# Patient Record
Sex: Male | Born: 1980 | Hispanic: Yes | Marital: Single
Health system: Southern US, Community
[De-identification: ages and names within clinical notes are randomized; demographics above are authoritative.]

## PROBLEM LIST (undated history)

## (undated) HISTORY — PX: HERNIA REPAIR: SHX51

---

## 2009-11-10 ENCOUNTER — Ambulatory Visit: Payer: Self-pay | Admitting: Family Medicine

## 2009-11-10 ENCOUNTER — Encounter: Payer: Self-pay | Admitting: Family Medicine

## 2009-11-10 DIAGNOSIS — M545 Low back pain, unspecified: Secondary | ICD-10-CM | POA: Insufficient documentation

## 2009-11-10 DIAGNOSIS — L639 Alopecia areata, unspecified: Secondary | ICD-10-CM | POA: Insufficient documentation

## 2009-11-13 LAB — CONVERTED CEMR LAB
Chloride: 105 meq/L (ref 96–112)
MCHC: 33.6 g/dL (ref 30.0–36.0)
Potassium: 4.2 meq/L (ref 3.5–5.3)
RBC: 4.82 M/uL (ref 4.22–5.81)
RDW: 12.5 % (ref 11.5–15.5)
Sodium: 144 meq/L (ref 135–145)
TSH: 1.697 microintl units/mL (ref 0.350–4.500)

## 2009-12-01 ENCOUNTER — Telehealth: Payer: Self-pay | Admitting: Family Medicine

## 2009-12-16 ENCOUNTER — Ambulatory Visit: Payer: Self-pay | Admitting: Family Medicine

## 2009-12-23 ENCOUNTER — Ambulatory Visit: Payer: Self-pay | Admitting: Family Medicine

## 2009-12-23 DIAGNOSIS — S91309A Unspecified open wound, unspecified foot, initial encounter: Secondary | ICD-10-CM | POA: Insufficient documentation

## 2010-01-13 ENCOUNTER — Telehealth: Payer: Self-pay | Admitting: Family Medicine

## 2010-01-13 ENCOUNTER — Ambulatory Visit: Payer: Self-pay | Admitting: Family Medicine

## 2010-01-13 DIAGNOSIS — M25569 Pain in unspecified knee: Secondary | ICD-10-CM | POA: Insufficient documentation

## 2010-01-28 ENCOUNTER — Telehealth: Payer: Self-pay | Admitting: Family Medicine

## 2010-02-25 ENCOUNTER — Ambulatory Visit: Payer: Self-pay | Admitting: Family Medicine

## 2010-05-13 ENCOUNTER — Ambulatory Visit: Payer: Self-pay | Admitting: Family Medicine

## 2010-07-16 ENCOUNTER — Ambulatory Visit: Payer: Self-pay | Admitting: Family Medicine

## 2010-08-18 ENCOUNTER — Ambulatory Visit: Payer: Self-pay | Admitting: Family Medicine

## 2010-10-05 NOTE — Assessment & Plan Note (Signed)
Summary: knee pain/[place]   Vital Signs:  Patient profile:   30 year old male Height:      63 inches Weight:      123 pounds BMI:     21.87 BSA:     1.57 Temp:     98.6 degrees F Pulse rate:   57 / minute BP sitting:   114 / 66  Vitals Entered By: Jone Baseman CMA (Jan 13, 2010 2:49 PM) CC: right knee pain x 1 day Is Patient Diabetic? No Pain Assessment Patient in pain? yes     Location: right knee Intensity: 7   CC:  right knee pain x 1 day.  History of Present Illness: 1. R knee pain Noticed R knee pain yesterday. Progressed today. Denies any injury. No history of problems with the knee. Denies any swelling. Has taken tylenol with no improvement in symptoms.   no other joint aches.   fevers:   no  chills: no     nausea: no    vomiting: no    diarrhea: no       Habits & Providers  Alcohol-Tobacco-Diet     Tobacco Status: never  Current Medications (verified): 1)  Betamethasone Dipropionate 0.05 % Crea (Betamethasone Dipropionate) .... Apply To Affected Areas and 1 Cm Beyond Two Times A Day 2)  Diclofenac Sodium 75 Mg Tbec (Diclofenac Sodium) .... One By Mouth Two Times A Day As Needed For Back Pain; Take With Food 3)  Flexeril 5 Mg Tabs (Cyclobenzaprine Hcl) .... One By Mouth Three Times A Day As Needed For Back Pain 4)  Lidocaine-Prilocaine 2.5-2.5 % Crea (Lidocaine-Prilocaine) .... Apply Generously To Areas On Scalp 1.5-2 Hours Before Appointment Then Cover With Tight-Fitting Shower Cap or Cap Until Seen  Allergies (verified): No Known Drug Allergies  Physical Exam  General:  vital signs reviewed and normal Alert, appropriate; well-dressed and well-nourished  Msk:  Knee: Normal to inspection with no erythema or effusion or obvious bony abnormalities. Palpation normal with no warmth or joint line tenderness or patellar tenderness; + tenderness at fibular head ROM normal in flexion and extension and lower leg rotation. Ligaments with solid consistent  endpoints including ACL, PCL, LCL, MCL. Negative Mcmurray's and provocative meniscal tests. Pain with pivot-shift testing. Pt has pain with full extension and unilateral R leg stance . Non painful patellar compression. Patellar and quadriceps tendons unremarkable. Hamstring and quadriceps strength is normal.     Impression & Recommendations:  Problem # 1:  KNEE PAIN, ACUTE (ICD-719.46) Assessment New acute onset. Normal gait. Exam positive for tenderness at fibular head and difficulty with single leg standing; otherwise unremarkable. Possibly tendonitis. No injury. Diclofenac has not helped so will change to naproxen. Ice, elevation otherwise. Knee sleeve given. If no improvement would consider knee films. Return parameters discussed.  Patient agreeable.   The following medications were removed from the medication list:    Diclofenac Sodium 75 Mg Tbec (Diclofenac sodium) ..... One by mouth two times a day as needed for back pain; take with food His updated medication list for this problem includes:    Flexeril 5 Mg Tabs (Cyclobenzaprine hcl) ..... One by mouth three times a day as needed for back pain    Naproxen 250 Mg Tabs (Naproxen) ..... One tab every 6 hours as needed for pain  Orders: [facility]- Est Level  3 ([address])  Complete Medication List: 1)  Betamethasone Dipropionate 0.05 % Crea (Betamethasone dipropionate) .... Apply to affected areas and 1 cm beyond two times  a day 2)  Flexeril 5 Mg Tabs (Cyclobenzaprine hcl) .... One by mouth three times a day as needed for back pain 3)  Lidocaine-prilocaine 2.5-2.5 % Crea (Lidocaine-prilocaine) .... Apply generously to areas on scalp 1.5-2 hours before appointment then cover with tight-fitting shower cap or cap until seen 4)  Naproxen 250 Mg Tabs (Naproxen) .... One tab every 6 hours as needed for pain  Patient Instructions: 1)  ice the knee three times a day for 20 minutes 2)  keep it elevated when you're not moving 3)  take the pain  medicine as needed 4)  stop the diclofenac Prescriptions: NAPROXEN 250 MG TABS (NAPROXEN) one tab every 6 hours as needed for pain  #60 x 0   Entered and Authorized by:   Myrtie Soman  MD   Signed by:   Myrtie Soman  MD on 01/13/2010   Method used:   Electronically to        [facility] (retail)       [address]       [place], [place]  [address]       Ph: [phone]       Fax: [phone]   RxID:   [id number]

## 2010-10-05 NOTE — Progress Notes (Signed)
Summary: resch  Phone Note Call from Patient   Caller: Patient Summary of Call: had to resch due to having to work Initial call taken by: De Nurse,  Jan 28, 2010 9:32 AM

## 2010-10-05 NOTE — Progress Notes (Signed)
Summary: triage  Phone Note Call from Patient Call back at [phone]   Caller: other Relative-Karen Summary of Call: n/v/fever Initial call taken by: De Nurse,  December 01, 2009 8:40 AM  Follow-up for Phone Call        Spoke with [place]. started yesterday. took tylnol last night. unable to come this am. placed in 3pm work in. they are aware of wait. told her to have him take the tylenol every 4 hrs if still feverish. advised small sips of ginger ale or other fluids every 10 minutes as tolerated. when he begins to eat again, try crackers first. nothing fried or greasy. she agreed with plan & said if he feels better she might call & cancel appt Follow-up by: Golden Circle RN,  December 01, 2009 8:51 AM

## 2010-10-05 NOTE — Assessment & Plan Note (Signed)
Summary: F/U VISIT/[facility]   Vital Signs:  Patient profile:   30 year old male Height:      63 inches Weight:      124 pounds BMI:     22.05 BSA:     1.58 Temp:     98.2 degrees F Pulse rate:   61 / minute BP sitting:   117 / 67  Vitals Entered By: Jone Baseman CMA (February 25, 2010 4:26 PM) CC: f/u hair Is Patient Diabetic? No Pain Assessment Patient in pain? no        CC:  f/u hair.  History of Present Illness: 1. 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 Has had good hair growth since last visit. Given good response, would like only 3 areas injected today.  Habits & Providers  Alcohol-Tobacco-Diet     Tobacco Status: never  Allergies: No Known Drug Allergies  Physical Exam  Head:  3 scattered discrete patches of hair loss - largest quarter-size; fine hairs--some dark are regrowing in these areas -- no scarring; no scaling lesions Additional Exam:  Intralesional steroid injections:  Pt identified and consent obtained. Areas of alopecia cleaned and prepped in sterile fashion. Multiple intralesional injections of kennalog 10 mg/mL were injected into the areas of alopecia -- 3 areas in total injected -- all on the crown. Pt tolerated the procedure without difficulty. No signficant bleeding. Total of 1.2 mL of triamcinolone injected.    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 Would like to to wait 8 weeks before next set of injections as wife's due date is in July. Has had a good response so far.   Orders: Provider Misc Charge- [facility] (Misc)  Complete Medication List: 1)  Betamethasone Dipropionate 0.05 % Crea (Betamethasone dipropionate) .... Apply to affected areas and 1 cm beyond two times a day 2)  Flexeril 5 Mg Tabs (Cyclobenzaprine hcl) .... One by mouth three times a day as needed for back pain 3)  Lidocaine-prilocaine 2.5-2.5 % Crea  (Lidocaine-prilocaine) .... Apply generously to areas on scalp 1.5-2 hours before appointment then cover with tight-fitting shower cap or cap until seen 4)  Naproxen 250 Mg Tabs (Naproxen) .... One tab every 6 hours as needed for pain  Patient Instructions: 1)  follow-up in 8 weeks for another set of injections. 2)  I'll talk to Dr. Wallene Huh about you.

## 2010-10-05 NOTE — Progress Notes (Signed)
Summary: triage  Phone Note Call from Patient Call back at Home Phone [phone]   Caller: Patient Summary of Call: having problem w/ knee and wants to be seen today Initial call taken by: De Nurse,  Jan 13, 2010 10:22 AM  Follow-up for Phone Call        pain started yesterday. difficult to walk. took tylenol last night. has other meds for this on med list. cannot come until 3 today. aware there may be a wait Follow-up by: Golden Circle RN,  Jan 13, 2010 10:28 AM

## 2010-10-05 NOTE — Assessment & Plan Note (Signed)
Summary: STEROID INJ/KH   Vital Signs:  Patient profile:   30 year old male Height:      63 inches Weight:      122.4 pounds BMI:     21.76 Temp:     98.0 degrees F oral Pulse rate:   61 / minute BP sitting:   108 / 64  (right arm) Cuff size:   regular  Vitals Entered By: Garen Grams LPN (December 23, 2009 3:22 PM) CC: steriod injection Is Patient Diabetic? No Pain Assessment Patient in pain? no        CC:  steriod injection.  History of Present Illness: 1. Alopecia areata Here for intralesional steroid injection to areas of alopecia on scalp. Pt self-treated with EMLA prior to today's visit.  2. tetanus shot Pt would like a tetanus booster. He notes that he stepped on a nail about a week ago. The wound has healed without an problems. States his last tetanus shot was 11 or 12 years ago. No redness, pain, erythema, drainage, or discharge from the area.   ROS: occasional back pain after work; ros otherwise negative.   Habits & Providers  Alcohol-Tobacco-Diet     Tobacco Status: never  Allergies: No Known Drug Allergies  Physical Exam  General:  vital signs reviewed and normal Alert, appropriate; well-dressed and well-nourished  Head:  5 scattered discrete patches of hair loss - largest quarter-sized -- no scarring; no scaling lesions; some fine new hairs coming in most of the areas.  Extremities:  L foot -- small hyper pigmented area on plantar forefoot. No erythema, drainage, or discharge. No tenderness to palpation. Additional Exam:  Intralesional steroid injections:  Pt identified and consent obtained. Areas of alopecia cleaned and prepped in sterile fashion. Multiple intralesional injections of kennalog 10 mg/mL were injected into the areas of alopecia -- five areas in total include bilateral temporal areas, posterior R neckline, 2 areas on the crown. Pt tolerated the procedure without difficulty. No signficant bleeding. Total of 2 mL of triamcinolone injected.     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 Will have back in 4 weeks to eval for hair regrowth and consider repeat steroid injections at that time.   Orders: [facility]- Est Level  3 ([address]) Provider Misc Charge- [facility] (Misc)  Problem # 2:  WOUND, FOOT (ICD-892.0) Assessment: New  resolving. No active signs of infection. Will give tetanus toxoid today in the form of Tdap.   Orders: [facility]- Est Level  3 ([address])  Complete Medication List: 1)  Betamethasone Dipropionate 0.05 % Crea (Betamethasone dipropionate) .... Apply to affected areas and 1 cm beyond two times a day 2)  Diclofenac Sodium 75 Mg Tbec (Diclofenac sodium) .... One by mouth two times a day as needed for back pain; take with food 3)  Flexeril 5 Mg Tabs (Cyclobenzaprine hcl) .... One by mouth three times a day as needed for back pain 4)  Lidocaine-prilocaine 2.5-2.5 % Crea (Lidocaine-prilocaine) .... Apply generously to areas on scalp 1.5-2 hours before appointment then cover with tight-fitting shower cap or cap until seen  Other Orders: Tdap => 75yrs IM ([address]) Admin 1st Vaccine ([address])  Patient Instructions: 1)  follow-up in 4 weeks 2)  ice and massage your head tonight.   Prevention & Chronic Care Immunizations   Influenza vaccine: Not documented    Tetanus booster: 12/23/2009: Tdap    Pneumococcal vaccine: Not documented  Other Screening   Smoking  status: never  (12/23/2009)   Immunizations Administered:  Tetanus Vaccine:    Vaccine Type: Tdap    Site: left deltoid    Mfr: [employer]    Dose: 0.5 ml    Route: IM    Given by: Jone Baseman CMA    Exp. Date: 11/28/2011    Lot #: [id number]    VIS given: 07/24/07 version given December 23, 2009.

## 2010-10-05 NOTE — Assessment & Plan Note (Signed)
Summary: fu/kh   Vital Signs:  Patient profile:   30 year old male Height:      63 inches Weight:      122.8 pounds BMI:     21.83 Temp:     97.1 degrees F oral Pulse rate:   52 / minute BP sitting:   103 / 62  (left arm) Cuff size:   regular  Vitals Entered By: Garen Grams LPN (December 16, 2009 4:05 PM) CC: f/u hair loss Is Patient Diabetic? No Pain Assessment Patient in pain? no        CC:  f/u hair loss.  History of Present Illness: 1. hair loss Has patches of hair loss (3 spots) on scalp -- going on for about 1.5 years. Had a similar episode about 7 years ago. Doctors attributed it to stress - prescribed rogaine, vitamins and hair grew back. Has been using the betamethasone cream consistently for the last month and has not noticed any improvement.  Labs from previous visit including TSH, CBC, and BMP all normal.  Habits & Providers  Alcohol-Tobacco-Diet     Tobacco Status: never  Current Medications (verified): 1)  Betamethasone Dipropionate 0.05 % Crea (Betamethasone Dipropionate) .... Apply To Affected Areas and 1 Cm Beyond Two Times A Day 2)  Diclofenac Sodium 75 Mg Tbec (Diclofenac Sodium) .... One By Mouth Two Times A Day As Needed For Back Pain; Take With Food 3)  Flexeril 5 Mg Tabs (Cyclobenzaprine Hcl) .... One By Mouth Three Times A Day As Needed For Back Pain 4)  Lidocaine-Prilocaine 2.5-2.5 % Crea (Lidocaine-Prilocaine) .... Apply Generously To Areas On Scalp 1.5-2 Hours Before Appointment Then Cover With Tight-Fitting Shower Cap or Cap Until Seen  Allergies (verified): No Known Drug Allergies  Social History: Smoking Status:  never  Review of Systems       no weight loss, no increased stress; normal appetite.   Physical Exam  General:  vital signs reviewed and normal Alert, appropriate; well-dressed and well-nourished  Head:  5 scattered discrete patches of hair loss - largest quarter-sized -- no scarring; no scaling lesions; some fine new hairs  coming in most of the areas.  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 Will have back for intralesional steroid injections. Prescribed EMLA cream 1.5 -2 hours prior to appointment.  Orders: [facility]- Est Level  3 ([address])  Complete Medication List: 1)  Betamethasone Dipropionate 0.05 % Crea (Betamethasone dipropionate) .... Apply to affected areas and 1 cm beyond two times a day 2)  Diclofenac Sodium 75 Mg Tbec (Diclofenac sodium) .... One by mouth two times a day as needed for back pain; take with food 3)  Flexeril 5 Mg Tabs (Cyclobenzaprine hcl) .... One by mouth three times a day as needed for back pain 4)  Lidocaine-prilocaine 2.5-2.5 % Crea (Lidocaine-prilocaine) .... Apply generously to areas on scalp 1.5-2 hours before appointment then cover with tight-fitting shower cap or cap until seen  Patient Instructions: 1)  follow-up in 2 weeks for steroid injections 2)  use the EMLA cream to help reduce pain with the injections 3)  The other medicines should be at the pharmacy. Prescriptions: FLEXERIL 5 MG TABS (CYCLOBENZAPRINE HCL) one by mouth  three times a day as needed for back pain  #60 x 1   Entered and Authorized by:   Myrtie Soman  MD   Signed by:   Myrtie Soman  MD on 12/16/2009   Method used:   Electronically to        [facility] (retail)       [address]       [place], [place]  [address]       Ph: [phone]       Fax: [phone]   RxID:   [id number] DICLOFENAC SODIUM 75 MG TBEC (DICLOFENAC SODIUM) one by mouth two times a day as needed for back pain; take with food  #60 x 1   Entered and Authorized by:   Myrtie Soman  MD   Signed by:   Myrtie Soman  MD on 12/16/2009   Method used:   Electronically to        [facility] (retail)       [address]       [place], [place]  [address]       Ph: [phone]       Fax: [phone]   RxID:   [id number] LIDOCAINE-PRILOCAINE 2.5-2.5 % CREA (LIDOCAINE-PRILOCAINE) apply generously to areas on scalp 1.5-2 hours before appointment then cover with tight-fitting shower cap or cap until seen  #30 g x 0   Entered and Authorized by:   Myrtie Soman  MD   Signed by:   Myrtie Soman  MD on 12/16/2009   Method used:   Electronically to        [facility] (retail)       [address]       [place], [place]  [address]       Ph: [phone]       Fax: [phone]   RxID:   [id number]

## 2010-10-05 NOTE — Assessment & Plan Note (Signed)
Summary: NP,tcb   Vital Signs:  Patient profile:   30 year old male Height:      63 inches Weight:      123 pounds BMI:     21.87 BSA:     1.57 Temp:     98.3 degrees F Pulse rate:   82 / minute BP sitting:   135 / 79  Vitals Entered By: Jone Baseman CMA (November 10, 2009 4:06 PM) CC: New patient Is Patient Diabetic? No Pain Assessment Patient in pain? yes     Location: back Intensity: 6   CC:  New patient.  History of Present Illness: 1. back pain -  Location: low back Duration: 2 years trauma/injury?: horse fell on top of him in [place] about 12 years - no back pain after this radiation?: no  weakness/numbness?: occasionally feels weak in the legs problems with bowel or bladder control?: sometimes has urinary urgency, no accidents history of cancer?: no history of osteoporosis?: no self-treatment:  nothing  2. hair loss Has patches of hair loss (3 spots) on scalp -- going on for about 1.5 years. Had a similar episode about 7 years ago. Doctors attributed it to stress - prescribed rogaine, vitamins and hair grew back.   Habits & Providers  Alcohol-Tobacco-Diet     Tobacco Status: quit     Year Quit: 1.5 years  Current Medications (verified): 1)  Betamethasone Dipropionate 0.05 % Crea (Betamethasone Dipropionate) .... Apply To Affected Areas and 1 Cm Beyond Two Times A Day 2)  Diclofenac Sodium 75 Mg Tbec (Diclofenac Sodium) .... One By Mouth Two Times A Day As Needed For Back Pain; Take With Food 3)  Flexeril 5 Mg Tabs (Cyclobenzaprine Hcl) .... One By Mouth Three Times A Day As Needed For Back Pain  Allergies (verified): No Known Drug Allergies  Past History:  Past Medical History: chronic back pain hair loss  Past Surgical History: hernia surgery at age 20  Family History: father - diabetes, HLD mother - HLD  no cancer, no bleeding or clotting problems  Social History: works Holiday representative as it comes; quit smoking 1.5 years ago 10 years (1  PPD); no alcohol; - stopped 1.5 years ago no current drug use (previously used cocaine - stopped 1.5 years); wife is pregnant with first son (due July 2011); moved from [place] 10 years ago. Smoking Status:  quit  Review of Systems       normal appetite; no recent weight gain or weight loss; no new vision changes; no chest pain, occasional SOB. No syncope or presyncope.   Physical Exam  General:  vital signs reviewed and normal Alert, appropriate; well-dressed and well-nourished  Head:  5 scattered discrete patches of complete hair loss - largest quarter-sized -- no scarring; no scaling lesions; negative fluorescence with woods lamp Neck:  normal ROM, no stiffness; no lymphadenopathy, no masses Lungs:  work of breathing unlabored, clear to auscultation bilaterally; no wheezes, rales, or ronchi; good air movement throughout  Heart:  regular rate and rhythm, no murmurs; normal s1/s2  Msk:  Back exam -- TTP over L2-L4, no point tenderness. no obvious spasm. Normal ROM with forward, lateral flexion, rotatioin and extension of spine. Some pain at extent of ROM with each. SLR negative bilaterally. Patellar and achilles DTRs normal bilaterally. Normal sensation bilateral LEs. Babinski equivocal bilaterally. No saddle anesthesia Extremities:  no cyanosis, clubbing, or edema  Neurologic:  alert and oriented. speech normal. station and gait normal. no gross deficitis.  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 Start topical high-potency steroids and follow. If no improvement would consider referral to derm.   Orders: Basic Met-[facility] [id number]) CBC-[facility] ([address]) TSH-[facility] [phone])  Problem # 2:  LOW BACK PAIN, CHRONIC (ICD-724.2) Assessment: New no red flags; defer imaging at this time. Doubt that history of remote trauma (horse falling on him) is related. 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 ROM, strengthening exercises; flexeril and diclofenac as needed.  His updated medication list for this problem includes:    Diclofenac Sodium 75 Mg Tbec (Diclofenac sodium) ..... One by mouth two times a day as needed for back pain; take with food    Flexeril 5 Mg Tabs (Cyclobenzaprine hcl) ..... One by mouth three times a day as needed for back pain  Complete Medication List: 1)  Betamethasone Dipropionate 0.05 % Crea (Betamethasone dipropionate) .... Apply to affected areas and 1 cm beyond two times a day 2)  Diclofenac Sodium 75 Mg Tbec (Diclofenac sodium) .... One by mouth two times a day as needed for back pain; take with food 3)  Flexeril 5 Mg Tabs (Cyclobenzaprine hcl) .... One by mouth three times a day as needed for back pain  Patient Instructions: 1)  use the steroid cream twice a day on the balding areas 2)  use the diclofac and flexeril as needed 3)  do the back exercises to help with strength and range of motion 4)  follow-up with me in 1-2 months Prescriptions: FLEXERIL 5 MG TABS (CYCLOBENZAPRINE HCL) one by mouth three times a day as needed for back pain  #60 x 1   Entered and Authorized by:   Myrtie Soman  MD   Signed by:   Myrtie Soman  MD on 11/10/2009   Method used:   Electronically to        [facility] (retail)       [address]       [place], [place]  [address]       Ph: [phone]       Fax: [phone]   RxID:   [id number] DICLOFENAC SODIUM 75 MG TBEC (DICLOFENAC SODIUM) one by mouth two times a day as needed for back pain; take with food  #60 x 1   Entered and Authorized by:   Myrtie Soman  MD   Signed by:   Myrtie Soman  MD on 11/10/2009   Method used:   Electronically to        [facility] (retail)       [address]       [place], [place]  [address]       Ph: [phone]       Fax: [phone]   RxID:   [id number] BETAMETHASONE DIPROPIONATE 0.05 %  CREA (BETAMETHASONE DIPROPIONATE) apply to affected areas and 1 cm beyond two times a day  #60 g x 1   Entered and Authorized by:   Myrtie Soman  MD   Signed by:   Myrtie Soman  MD on 11/10/2009   Method used:   Electronically to        [facility] (retail)       [address]       [place], [place]  [address]       Ph: [phone]       Fax: [phone]   RxID:   [id number]

## 2010-10-07 NOTE — Assessment & Plan Note (Signed)
Summary: injection/eo   Vital Signs:  Patient profile:   30 year old male Height:      63 inches Weight:      131 pounds BMI:     23.29 BSA:     1.62 Temp:     97.5 degrees F Pulse rate:   51 / minute BP sitting:   108 / 65  Vitals Entered By: Jone Baseman CMA (August 18, 2010 3:35 PM) CC: alopecia areata    Primary Care Valeta Paz:  Mark Rumpf  MD  CC:  alopecia areata .  History of Present Illness: 1) Alopecia areata: Last treated in November w/ intralesional triamcinolone for areas of alopecia areata on scalp (after failure of topical corticosteroid). Has shown some improvement in injected areas per patient, but has also had two new spots. Reports areas in beard with some hair loss as well. Has not been tried on Minoxidil.   Denies hypothyroid symptoms   No meds currently.   Allergies (verified): No Known Drug Allergies  Physical Exam  General:  vital signs reviewed and normal Alert, appropriate; well-dressed and well-nourished  Head:  4  discrete patches of hair loss - largest quarter sized; fine hairs--some dark are regrowing in these areas -- no scarring; no scaling lesions 2 discrete patches of moderate hair loss at jaw line bilaterally  Additional Exam:  Areas of alopecia cleaned and prepped in sterile fashion. (4)  intralesional injections of kennalog 10 mg/mL were injected into the areas of alopecia -- 1 area left parietal, 1 area on the crown, 1 at nape on left, 1 at hairline left frontal. Pt tolerated the procedure without difficulty. No signficant bleeding. Total of 2 mL of triamcinolone injected.    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 Improving with treatment, though new spots as well. Will start Minoxidil concurrently.  Complete Medication List: 1)  Minoxidil 5 % Soln (Minoxidil) .... Apply to affected areas of hair once a day. disp 1 month  supply  Patient Instructions: 1)  Come back in three months.  2)  Use the cream as directed.  Prescriptions: MINOXIDIL 5 % SOLN (MINOXIDIL) apply to affected areas of hair once a day. Disp 1 month supply  #1 x 3   Entered and Authorized by:   Mark Rumpf  MD   Signed by:   Mark Rumpf  MD on 08/18/2010   Method used:   Print then Give to Patient   RxID:   [id number]   Appended Document: injection/eo

## 2010-10-07 NOTE — Assessment & Plan Note (Signed)
Summary: f/up,tcb   Vital Signs:  Patient profile:   30 year old male Height:      63 inches Weight:      125.3 pounds BMI:     22.28 Temp:     98.4 degrees F oral Pulse rate:   64 / minute BP sitting:   130 / 72  (right arm) Cuff size:   regular  Vitals Entered By: Garen Grams LPN (May 13, 2010 4:01 PM) CC: f/u alopecia Is Patient Diabetic? No Pain Assessment Patient in pain? no        Primary Care Provider:  Bobby Rumpf  MD  CC:  f/u alopecia.  History of Present Illness: 1) Alopecia areata: treated about 4 months ago with intralesional triamcinolone for  areas of alopecia areata on scalp (after failure of topical corticosteroid). Has shown some improvement per patient report since then. Missed follow up visit for further injection. Reports areas in beard with some hair loss.     Habits & Providers  Alcohol-Tobacco-Diet     Tobacco Status: never     Year Quit: 1.5 years  Current Medications (verified): 1)  Betamethasone Dipropionate 0.05 % Crea (Betamethasone Dipropionate) .... Apply To Affected Areas and 1 Cm Beyond Two Times A Day 2)  Flexeril 5 Mg Tabs (Cyclobenzaprine Hcl) .... One By Mouth Three Times A Day As Needed For Back Pain 3)  Lidocaine-Prilocaine 2.5-2.5 % Crea (Lidocaine-Prilocaine) .... Apply Generously To Areas On Scalp 1.5-2 Hours Before Appointment Then Cover With Tight-Fitting Shower Cap or Cap Until Seen 4)  Naproxen 250 Mg Tabs (Naproxen) .... One Tab Every 6 Hours As Needed For Pain  Allergies (verified): No Known Drug Allergies  Physical Exam  General:  vital signs reviewed and normal Alert, appropriate; well-dressed and well-nourished  Head:  2  discrete patches of hair loss - largest nickel sized; fine hairs--some dark are regrowing in these areas -- no scarring; no scaling lesions 2 discrete patches of moderate hair loss at jaw line bilaterally  Additional Exam:  Pt identified and consent obtained. Areas of alopecia cleaned and  prepped in sterile fashion. (2)  intralesional injections of kennalog 10 mg/mL were injected into the areas of alopecia -- two areas in total include left parietal, 1 area on the crown. Pt tolerated the procedure without difficulty. No signficant bleeding. Total of 1 mL of triamcinolone injected.     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 Follow in 4 weeks. Consider injection at site of hair loss on face as well at that time.   Orders: [facility]- Est Level  3 (46962)  Complete Medication List: 1)  Betamethasone Dipropionate 0.05 % Crea (Betamethasone dipropionate) .... Apply to affected areas and 1 cm beyond two times a day 2)  Flexeril 5 Mg Tabs (Cyclobenzaprine hcl) .... One by mouth three times a day as needed for back pain 3)  Lidocaine-prilocaine 2.5-2.5 % Crea (Lidocaine-prilocaine) .... Apply generously to areas on scalp 1.5-2 hours before appointment then cover with tight-fitting shower cap or cap until seen 4)  Naproxen 250 Mg Tabs (Naproxen) .... One tab every 6 hours as needed for pain  Patient Instructions: 1)  Follow up in 4 weeks to see if you need more injections and to see how the hair at your beard is doing.

## 2010-10-07 NOTE — Assessment & Plan Note (Signed)
Summary: F/U/KH   Vital Signs:  Patient profile:   30 year old male Height:      63 inches Weight:      132.8 pounds BMI:     23.61 Temp:     98.5 degrees F oral Pulse rate:   53 / minute BP sitting:   119 / 70  (left arm) Cuff size:   regular  Vitals Entered By: Garen Grams LPN (July 16, 2010 8:56 AM) CC: f/u alopecia Is Patient Diabetic? No Pain Assessment Patient in pain? no        Primary Care Theoren Palka:  Bobby Rumpf  MD  CC:  f/u alopecia.  History of Present Illness: 1) Alopecia areata: Last treated in September intralesional triamcinolone for areas of alopecia areata on scalp (after failure of topical corticosteroid). Has shown some improvement per patient report since then. Missed follow up visit for further injection. Reports areas in beard with some hair loss as well.   Denies hypothyroid symptoms     Habits & Providers  Alcohol-Tobacco-Diet     Tobacco Status: never     Year Quit: 1.5 years  Allergies: No Known Drug Allergies  Physical Exam  General:  vital signs reviewed and normal Alert, appropriate; well-dressed and well-nourished  Head:  3  discrete patches of hair loss - largest nickel sized; fine hairs--some dark are regrowing in these areas -- no scarring; no scaling lesions 2 discrete patches of moderate hair loss at jaw line bilaterally  Additional Exam:  Pt identified and consent obtained. Areas of alopecia cleaned and prepped in sterile fashion. (3)  intralesional injections of kennalog 10 mg/mL were injected into the areas of alopecia -- two areas in total include left parietal, 1 area on the crown. Pt tolerated the procedure without difficulty. No signficant bleeding. Total of 1 mL of triamcinolone injected.    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 Improving with treatment.   Other Orders: Flu Vaccine 73yrs + [phone]) Admin 1st Vaccine  (98119)   Orders Added: 1)  Flu Vaccine 61yrs + [14782] 2)  Admin 1st Vaccine [95621]   Immunizations Administered:  Influenza Vaccine # 1:    Vaccine Type: Fluvax 3+    Site: left deltoid    Mfr: GlaxoSmithKline    Dose: 0.5 ml    Route: IM    Given by: Garen Grams LPN    Exp. Date: 03/02/2011    Lot #: [id number]    VIS given: 03/30/10 version given July 16, 2010.  Flu Vaccine Consent Questions:    Do you have a history of severe allergic reactions to this vaccine? no    Any prior history of allergic reactions to egg and/or gelatin? no    Do you have a sensitivity to the preservative Thimersol? no    Do you have a past history of Guillan-Barre Syndrome? no    Do you currently have an acute febrile illness? no    Have you ever had a severe reaction to latex? no    Vaccine information given and explained to patient? yes   Immunizations Administered:  Influenza Vaccine # 1:    Vaccine Type: Fluvax 3+    Site: left deltoid    Mfr: GlaxoSmithKline    Dose: 0.5 ml    Route: IM    Given by: Garen Grams LPN    Exp. Date: 03/02/2011    Lot #: [id number]    VIS given:  03/30/10 version given July 16, 2010.    Appended Document: F/U/KH

## 2011-11-14 ENCOUNTER — Emergency Department (HOSPITAL_COMMUNITY): Payer: Self-pay

## 2011-11-14 ENCOUNTER — Encounter (HOSPITAL_COMMUNITY): Payer: Self-pay | Admitting: Emergency Medicine

## 2011-11-14 ENCOUNTER — Emergency Department (HOSPITAL_COMMUNITY)
Admission: EM | Admit: 2011-11-14 | Discharge: 2011-11-14 | Disposition: A | Discharge status: Home / Self Care | Payer: Self-pay | Attending: Emergency Medicine | Admitting: Emergency Medicine

## 2011-11-14 ENCOUNTER — Emergency Department (HOSPITAL_COMMUNITY)
Admission: EM | Admit: 2011-11-14 | Discharge: 2011-11-14 | Discharge status: Against Medical Advice | Payer: Self-pay | Attending: Emergency Medicine | Admitting: Emergency Medicine

## 2011-11-14 DIAGNOSIS — M545 Low back pain, unspecified: Secondary | ICD-10-CM | POA: Insufficient documentation

## 2011-11-14 DIAGNOSIS — X58XXXA Exposure to other specified factors, initial encounter: Secondary | ICD-10-CM | POA: Insufficient documentation

## 2011-11-14 DIAGNOSIS — S39012A Strain of muscle, fascia and tendon of lower back, initial encounter: Secondary | ICD-10-CM

## 2011-11-14 DIAGNOSIS — S335XXA Sprain of ligaments of lumbar spine, initial encounter: Secondary | ICD-10-CM | POA: Insufficient documentation

## 2011-11-14 DIAGNOSIS — G8929 Other chronic pain: Secondary | ICD-10-CM | POA: Insufficient documentation

## 2011-11-14 MED ORDER — IBUPROFEN 600 MG PO TABS: 600.0000 mg | ORAL_TABLET | Freq: Three times a day (TID) | ORAL | Status: AC | PRN

## 2011-11-14 MED ORDER — KETOROLAC TROMETHAMINE 60 MG/2ML IM SOLN
60.0000 mg | Freq: Once | INTRAMUSCULAR | Status: AC
  Administered 2011-11-14: 60 mg via INTRAMUSCULAR
  Filled 2011-11-14: qty 2

## 2011-11-14 MED ORDER — DIAZEPAM 5 MG PO TABS
5.0000 mg | ORAL_TABLET | Freq: Once | ORAL | Status: AC
  Administered 2011-11-14: 5 mg via ORAL
  Filled 2011-11-14: qty 1

## 2011-11-14 MED ORDER — HYDROCODONE-ACETAMINOPHEN 5-325 MG PO TABS: 2.0000 | ORAL_TABLET | ORAL | Status: DC | PRN

## 2011-11-14 MED ORDER — HYDROMORPHONE HCL PF 2 MG/ML IJ SOLN
2.0000 mg | Freq: Once | INTRAMUSCULAR | Status: AC
  Administered 2011-11-14: 2 mg via INTRAMUSCULAR
  Filled 2011-11-14: qty 1

## 2011-11-14 NOTE — ED Notes (Signed)
WRU:EAV4<UJ> Expected date:11/14/11<BR> Expected time: 7:44 AM<BR> Means of arrival:Ambulance<BR> Comments:<BR> 30yo/low back pain/chronic

## 2011-11-14 NOTE — Discharge Instructions (Signed)
Back Pain, Adult  Low back pain is very common. About 1 in 5 people have back pain.The cause of low back pain is rarely dangerous. The pain often gets better over time.About half of people with a sudden onset of back pain feel better in just 2 weeks. About 8 in 10 people feel better by 6 weeks.   CAUSES  Some common causes of back pain include:   Strain of the muscles or ligaments supporting the spine.   Wear and tear (degeneration) of the spinal discs.   Arthritis.   Direct injury to the back.  DIAGNOSIS  Most of the time, the direct cause of low back pain is not known.However, back pain can be treated effectively even when the exact cause of the pain is unknown.Answering your caregiver's questions about your overall health and symptoms is one of the most accurate ways to make sure the cause of your pain is not dangerous. If your caregiver needs more information, he or she may order lab work or imaging tests (X-rays or MRIs).However, even if imaging tests show changes in your back, this usually does not require surgery.  HOME CARE INSTRUCTIONS  For many people, back pain returns.Since low back pain is rarely dangerous, it is often a condition that people can learn to manageon their own.    Remain active. It is stressful on the back to sit or stand in one place. Do not sit, drive, or stand in one place for more than 30 minutes at a time. Take short walks on level surfaces as soon as pain allows.Try to increase the length of time you walk each day.   Do not stay in bed.Resting more than 1 or 2 days can delay your recovery.   Do not avoid exercise or work.Your body is made to move.It is not dangerous to be active, even though your back may hurt.Your back will likely heal faster if you return to being active before your pain is gone.   Pay attention to your body when you bend and lift. Many people have less discomfortwhen lifting if they bend their knees, keep the load close to their bodies,and  avoid twisting. Often, the most comfortable positions are those that put less stress on your recovering back.   Find a comfortable position to sleep. Use a firm mattress and lie on your side with your knees slightly bent. If you lie on your back, put a pillow under your knees.   Only take over-the-counter or prescription medicines as directed by your caregiver. Over-the-counter medicines to reduce pain and inflammation are often the most helpful.Your caregiver may prescribe muscle relaxant drugs.These medicines help dull your pain so you can more quickly return to your normal activities and healthy exercise.   Put ice on the injured area.   Put ice in a plastic bag.   Place a towel between your skin and the bag.   Leave the ice on for 15 to 20 minutes, 3 to 4 times a day for the first 2 to 3 days. After that, ice and heat may be alternated to reduce pain and spasms.   Ask your caregiver about trying back exercises and gentle massage. This may be of some benefit.   Avoid feeling anxious or stressed.Stress increases muscle tension and can worsen back pain.It is important to recognize when you are anxious or stressed and learn ways to manage it.Exercise is a great option.  SEEK MEDICAL CARE IF:   You have pain that is not   relieved with rest or medicine.   You have pain that does not improve in 1 week.   You have new symptoms.   You are generally not feeling well.  SEEK IMMEDIATE MEDICAL CARE IF:    You have pain that radiates from your back into your legs.   You develop new bowel or bladder control problems.   You have unusual weakness or numbness in your arms or legs.   You develop nausea or vomiting.   You develop abdominal pain.   You feel faint.  Document Released: 08/22/2005 Document Revised: 08/11/2011 Document Reviewed: 01/10/2011  Exit[employer] Patient Information 2012 [employer]    Lumbosacral Strain  Lumbosacral strain is one of the most common causes of back pain. There are many  causes of back pain. Most are not serious conditions.  CAUSES   Your backbone (spinal column) is made up of 24 main vertebral bodies, the sacrum, and the coccyx. These are held together by muscles and tough, fibrous tissue (ligaments). Nerve roots pass through the openings between the vertebrae. A sudden move or injury to the back may cause injury to, or pressure on, these nerves. This may result in localized back pain or pain movement (radiation) into the buttocks, down the leg, and into the foot. Sharp, shooting pain from the buttock down the back of the leg (sciatica) is frequently associated with a ruptured (herniated) disk. Pain may be caused by muscle spasm alone.  Your caregiver can often find the cause of your pain by the details of your symptoms and an exam. In some cases, you may need tests (such as X-rays). Your caregiver will work with you to decide if any tests are needed based on your specific exam.  HOME CARE INSTRUCTIONS    Avoid an underactive lifestyle. Active exercise, as directed by your caregiver, is your greatest weapon against back pain.   Avoid hard physical activities (tennis, racquetball, waterskiing) if you are not in proper physical condition for it. This may aggravate or create problems.   If you have a back problem, avoid sports requiring sudden body movements. Swimming and walking are generally safer activities.   Maintain good posture.   Avoid becoming overweight (obese).   Use bed rest for only the most extreme, sudden (acute) episode. Your caregiver will help you determine how much bed rest is necessary.   For acute conditions, you may put ice on the injured area.   Put ice in a plastic bag.   Place a towel between your skin and the bag.   Leave the ice on for 15 to 20 minutes at a time, every 2 hours, or as needed.   After you are improved and more active, it may help to apply heat for 30 minutes before activities.  See your caregiver if you are having pain that lasts  longer than expected. Your caregiver can advise appropriate exercises or therapy if needed. With conditioning, most back problems can be avoided.  SEEK IMMEDIATE MEDICAL CARE IF:    You have numbness, tingling, weakness, or problems with the use of your arms or legs.   You experience severe back pain not relieved with medicines.   There is a change in bowel or bladder control.   You have increasing pain in any area of the body, including your belly (abdomen).   You notice shortness of breath, dizziness, or feel faint.   You feel sick to your stomach (nauseous), are throwing up (vomiting), or become sweaty.   You   notice discoloration of your toes or legs, or your feet get very cold.   Your back pain is getting worse.   You have a fever.  MAKE SURE YOU:    Understand these instructions.   Will watch your condition.   Will get help right away if you are not doing well or get worse.  Document Released: 06/01/2005 Document Revised: 08/11/2011 Document Reviewed: 11/21/2008  [employer] Patient Information 2012 [employer]

## 2011-11-14 NOTE — ED Provider Notes (Addendum)
History     CSN: [id number]  Arrival date & time 11/14/11  4098   First MD Initiated Contact with Patient 11/14/11 [phone]      Chief Complaint  Patient presents with  . Back Pain    hx of recurrent low back pain    (Consider location/radiation/quality/duration/timing/severity/associated sxs/prior treatment) Patient is a 31 y.o. male presenting with back pain. The history is provided by the patient and a relative.  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 The current episode started less than 1 hour ago. The problem occurs constantly. The problem has not changed since onset.Associated with: The patient was brushing his teeth, gag, and had acute onset of low back pain at the midline lower lumbar sacral spine. The pain is present in the lumbar spine and sacro-iliac joint. The quality of the pain is described as aching. The pain does not radiate. The pain is at a severity of 8/10. The pain is severe. The symptoms are aggravated by bending, twisting and certain positions. Pertinent negatives include no chest pain, no fever, no numbness, no headaches, no abdominal pain, no bowel incontinence, no perianal numbness, no bladder incontinence, no dysuria, no pelvic pain, no leg pain, no paresthesias, no paresis, no tingling and no weakness. He has tried nothing for the symptoms. Risk factors: History of prior lumbar spine injury after falling off of a horse 15 years ago.    History reviewed. No pertinent past medical history.  History reviewed. No pertinent past surgical history.  History reviewed. No pertinent family history.  History  Substance Use Topics  . Smoking status: Never Smoker   . Smokeless tobacco: Not on file  . Alcohol Use: Yes      Review of Systems  Constitutional: Negative for fever, chills, appetite change and fatigue.  HENT: Negative for neck pain and neck stiffness.     Eyes: Negative.   Respiratory: Negative for shortness of breath.   Cardiovascular: Negative for chest pain.  Gastrointestinal: Negative for nausea, vomiting, abdominal pain and bowel incontinence.  Genitourinary: Negative.  Negative for bladder incontinence, dysuria and pelvic pain.  Musculoskeletal: Positive for back pain. Negative for myalgias, joint swelling and arthralgias.  Skin: Negative for rash.  Neurological: Negative for tingling, weakness, numbness, headaches and paresthesias.  Hematological: Does not bruise/bleed easily.  Psychiatric/Behavioral: Negative.     Allergies  Review of patient's allergies indicates not on file.  Home Medications   No current outpatient prescriptions on file.  BP 104/54  Pulse 68  Temp(Src) 98.4 F (36.9 C) (Oral)  Resp 18  SpO2 100%  Physical Exam  Nursing note and vitals reviewed. Constitutional: He is oriented to person, place, and time. He appears well-developed and well-nourished. He appears distressed.  HENT:  Head: Normocephalic and atraumatic.  Mouth/Throat: Oropharynx is clear and moist.  Eyes: EOM are normal. Pupils are equal, round, and reactive to light.  Neck: Trachea normal, normal range of motion, full passive range of motion without pain and phonation normal. Neck supple. No spinous process tenderness and no muscular tenderness present. No rigidity. Normal range of motion present.  Cardiovascular: Normal rate, regular rhythm, normal heart sounds and intact distal pulses.  Exam reveals no gallop and no friction rub.   No murmur heard. Pulmonary/Chest: Effort normal and breath sounds normal. No respiratory distress. He has no wheezes. He has no rales. He exhibits no tenderness.  Abdominal: Soft. Bowel sounds are normal. He exhibits no distension. There is no tenderness. There is no rebound and no guarding.  Musculoskeletal: He exhibits tenderness. He exhibits no edema.       Cervical back: Normal.       Thoracic back:  Normal.       Lumbar back: He exhibits decreased range of motion, tenderness, bony tenderness, pain and spasm. He exhibits no swelling, no edema, no deformity and no laceration.  Neurological: He is alert and oriented to person, place, and time. He has normal reflexes. He displays normal reflexes. No cranial nerve deficit or sensory deficit. He exhibits normal muscle tone. Coordination normal. GCS eye subscore is 4. GCS verbal subscore is 5. GCS motor subscore is 6.  Reflex Scores:      Patellar reflexes are 2+ on the right side and 2+ on the left side.      Achilles reflexes are 2+ on the right side and 2+ on the left side. Skin: Skin is warm and dry. No rash noted. He is not diaphoretic. No erythema. No pallor.  Psychiatric: He has a normal mood and affect. His behavior is normal. Judgment and thought content normal.    ED Course  Procedures (including critical care time)  Labs Reviewed - No data to display Dg Lumbar Spine Complete  11/14/2011  *RADIOLOGY REPORT*  Clinical Data: Low back pain  LUMBAR SPINE - COMPLETE 4+ VIEW  Comparison: None.  Findings: Five lumbar-type vertebral bodies.  Normal lumbar lordosis.  No evidence of fracture or dislocation.  Vertebral body heights and intervertebral disc spaces are maintained.  The visualized bony pelvis is within normal limits.  4 mm calcification overlying the right pelvis, distal ureteral calculus not excluded.  IMPRESSION: Normal lumbar spine radiographs.  4 mm calcification overlying the right pelvis, distal ureteral calculus not excluded.  Original Report Authenticated By: Charline Bills, M.D.   10:26 AM X-ray images have been reviewed by me, the patient is having no abdominal pain or flank pain to suggest kidney stone. I have reevaluated the patient at this time, and he is in no distress, reporting improvement of his back pain. I reevaluated his abdomen and bilateral flanks for any tenderness and he denies any pain or tenderness at these  locations and denies any urinary symptoms to suggest acute kidney stone passage. He has no lower extremity neurologic deficits and at this time I believe that his pain was a result of a lumbar strain and muscle spasm. I will discharge patient home with analgesics and muscle relaxants. The patient states his understanding of and agreement with the plan of care.  No diagnosis found.    MDM  Likely lumbar strain and muscle spasm, as there was no significant direct injury to the lumbosacral spine to cause acute fracture or dislocation, however the patient may have old underlying degenerative disc disease that may have flared with disc herniation do to acute muscular contraction during gagging. Patient does not have any radicular symptoms in his lower extremities. I will obtain a x-ray of his lumbar spine to evaluate the bony anatomy, and treat his pain with NSAIDs, opiates, benzodiazepines (for muscle relaxant). I do not suspect urinary tract infection or pyelonephritis based on history and physical exam.        Felisa Bonier, MD 11/14/11 1610  Felisa Bonier, MD 11/14/11 1027

## 2011-11-14 NOTE — ED Notes (Signed)
Bed:WTR6<BR> Expected date:<BR> Expected time:<BR> Means of arrival:<BR> Comments:<BR> Pt in room

## 2011-11-14 NOTE — ED Notes (Signed)
2 rx presented and reviewed with pt. Pt was able to stand and ambulate to wheelchair

## 2011-11-18 ENCOUNTER — Encounter (HOSPITAL_COMMUNITY): Payer: Self-pay | Admitting: Emergency Medicine

## 2011-11-22 ENCOUNTER — Ambulatory Visit (INDEPENDENT_AMBULATORY_CARE_PROVIDER_SITE_OTHER): Payer: Self-pay | Admitting: Family Medicine

## 2011-11-22 ENCOUNTER — Encounter: Payer: Self-pay | Admitting: Family Medicine

## 2011-11-22 VITALS — BP 126/74 | HR 60 | Temp 98.0°F | Ht 62.0 in | Wt 125.0 lb

## 2011-11-22 DIAGNOSIS — M999 Biomechanical lesion, unspecified: Secondary | ICD-10-CM

## 2011-11-22 DIAGNOSIS — M9903 Segmental and somatic dysfunction of lumbar region: Secondary | ICD-10-CM

## 2011-11-22 DIAGNOSIS — M545 Low back pain: Secondary | ICD-10-CM

## 2011-11-22 NOTE — Progress Notes (Signed)
  Subjective:    Patient ID: Mark Mills, male    DOB: 12/16/1980, 31 y.o.   MRN: [id number]  HPI 31 year old male coming in with low back pain. Patient has had a chronic history of this previously. Patient states that approximately 10-15 years ago that he fell off a horse and since that time has had some pain off and on. 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 Patient only weighs 125 pounds. Patient states the pain is usually in the mid back very low area may be right greater than left no radiation to the legs no bowel or bladder incontinence no numbness or loss of sensation or strength. Patient able to do most activities of daily living without any trouble. Patient was seen last week in the emergency department where he did have x-rays done. X-rays were reviewed and they were normal with no signs of spondylolisthesis spondylolyses. Patient states that he was not able to take the medications because they made him nauseous. Patient though states that it has been improving over the course of time.   Review of Systems Denies fever, weight loss, abdominal pain, or any dysuria.    Objective:   Physical Exam General: No apparent distress healthy male Cardiovascular regular rate and rhythm no murmur Pulmonary: Clear to auscultation bilaterally  Back - Normal skin,    Range of motion is full at neck and lumbar sacral regions negative straight leg test bilaterally neurovascularly intact distally.  OMT Physical Exam  Standing structural       Occiput neutral  Shoulder right higher  Inferior angle of scapula right higher  Illiac crest neutral  Medial malleolus neutral  Standing flexion right  Seated Flexion right  Cervical : C 6 extended rotated and side bent right  Thoracic: Elevated first rib on right T5 extended rotated and side bent left  Lumbar L2 flexed rotated and side bent right  Sacrum right on  right  Illium anterior right ileum    Assessment & Plan:

## 2011-11-22 NOTE — Assessment & Plan Note (Signed)
Decision today to treat with OMT was based on Physical Exam  After verbal consent patient was treated with muscle energy and HVLA techniques in lumbar, sacral and thoracic areas  Patient tolerated the procedure well with improvement in symptoms  Patient given exercises, stretches and lifestyle modifications  See medications in patient instructions if given  Patient will follow up in 3 weeks

## 2011-11-22 NOTE — Assessment & Plan Note (Signed)
Mechanical in nature would not give patient narcotics for this type of pain. Encourage patient to do exercises as well as stretches after work. Patient can followup as well for any manipulation if this did help.

## 2020-03-13 ENCOUNTER — Emergency Department (HOSPITAL_COMMUNITY): Payer: Self-pay

## 2020-03-13 ENCOUNTER — Emergency Department (HOSPITAL_COMMUNITY)
Admission: EM | Admit: 2020-03-13 | Discharge: 2020-03-14 | Disposition: A | Discharge status: Home / Self Care | Payer: Self-pay | Attending: Emergency Medicine | Admitting: Emergency Medicine

## 2020-03-13 DIAGNOSIS — M5127 Other intervertebral disc displacement, lumbosacral region: Secondary | ICD-10-CM | POA: Insufficient documentation

## 2020-03-13 DIAGNOSIS — M50321 Other cervical disc degeneration at C4-C5 level: Secondary | ICD-10-CM | POA: Insufficient documentation

## 2020-03-13 DIAGNOSIS — Y908 Blood alcohol level of 240 mg/100 ml or more: Secondary | ICD-10-CM | POA: Insufficient documentation

## 2020-03-13 DIAGNOSIS — F10920 Alcohol use, unspecified with intoxication, uncomplicated: Secondary | ICD-10-CM

## 2020-03-13 DIAGNOSIS — Y999 Unspecified external cause status: Secondary | ICD-10-CM | POA: Insufficient documentation

## 2020-03-13 DIAGNOSIS — Y9389 Activity, other specified: Secondary | ICD-10-CM | POA: Insufficient documentation

## 2020-03-13 DIAGNOSIS — R0681 Apnea, not elsewhere classified: Secondary | ICD-10-CM | POA: Insufficient documentation

## 2020-03-13 DIAGNOSIS — Y92410 Unspecified street and highway as the place of occurrence of the external cause: Secondary | ICD-10-CM | POA: Insufficient documentation

## 2020-03-13 DIAGNOSIS — M6281 Muscle weakness (generalized): Secondary | ICD-10-CM | POA: Insufficient documentation

## 2020-03-13 DIAGNOSIS — R748 Abnormal levels of other serum enzymes: Secondary | ICD-10-CM | POA: Insufficient documentation

## 2020-03-13 DIAGNOSIS — M50322 Other cervical disc degeneration at C5-C6 level: Secondary | ICD-10-CM | POA: Insufficient documentation

## 2020-03-13 DIAGNOSIS — T1490XA Injury, unspecified, initial encounter: Secondary | ICD-10-CM

## 2020-03-13 LAB — COMPREHENSIVE METABOLIC PANEL
ALT: 39 U/L (ref 0–44)
AST: 66 U/L — ABNORMAL HIGH (ref 15–41)
Albumin: 4.1 g/dL (ref 3.5–5.0)
Alkaline Phosphatase: 70 U/L (ref 38–126)
Anion gap: 17 — ABNORMAL HIGH (ref 5–15)
BUN: 9 mg/dL (ref 6–20)
CO2: 19 mmol/L — ABNORMAL LOW (ref 22–32)
Calcium: 7.9 mg/dL — ABNORMAL LOW (ref 8.9–10.3)
Chloride: 103 mmol/L (ref 98–111)
Creatinine, Ser: 1.06 mg/dL (ref 0.61–1.24)
GFR calc Af Amer: 49 mL/min — ABNORMAL LOW (ref >60)
GFR calc non Af Amer: 42 mL/min — ABNORMAL LOW (ref >60)
Glucose, Bld: 191 mg/dL — ABNORMAL HIGH (ref 70–99)
Potassium: 3.9 mmol/L (ref 3.5–5.1)
Sodium: 139 mmol/L (ref 135–145)
Total Bilirubin: 1 mg/dL (ref 0.3–1.2)
Total Protein: 7.4 g/dL (ref 6.5–8.1)

## 2020-03-13 LAB — PROTIME-INR
INR: 1 (ref 0.8–1.2)
Prothrombin Time: 13.2 seconds (ref 11.4–15.2)

## 2020-03-13 LAB — SAMPLE TO BLOOD BANK

## 2020-03-13 LAB — CBC
HCT: 43.8 % (ref 39.0–52.0)
Hemoglobin: 14.6 g/dL (ref 13.0–17.0)
MCH: 29.8 pg (ref 26.0–34.0)
MCHC: 33.3 g/dL (ref 30.0–36.0)
MCV: 89.4 fL (ref 80.0–100.0)
Platelets: 232 10*3/uL (ref 150–400)
RBC: 4.9 MIL/uL (ref 4.22–5.81)
RDW: 12.3 % (ref 11.5–15.5)
WBC: 8.3 10*3/uL (ref 4.0–10.5)
nRBC: 0 % (ref 0.0–0.2)

## 2020-03-13 LAB — LACTIC ACID, PLASMA: Lactic Acid, Venous: 5 mmol/L (ref 0.5–1.9)

## 2020-03-13 LAB — ETHANOL: Alcohol, Ethyl (B): 254 mg/dL — ABNORMAL HIGH (ref <10)
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
## 2020-03-13 MED ORDER — IOHEXOL 300 MG/ML  SOLN
100.0000 mL | Freq: Once | INTRAMUSCULAR | Status: AC | PRN
  Administered 2020-03-13: 100 mL via INTRAVENOUS
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
## 2020-03-13 NOTE — ED Notes (Signed)
To CT scan

## 2020-03-13 NOTE — ED Notes (Signed)
Patient transported to CT w/ TRN Clydie Braun
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
## 2020-03-14 ENCOUNTER — Emergency Department (HOSPITAL_COMMUNITY): Payer: Self-pay

## 2020-03-14 ENCOUNTER — Encounter (HOSPITAL_COMMUNITY): Payer: Self-pay | Admitting: Radiology

## 2020-03-14 LAB — URINALYSIS, ROUTINE W REFLEX MICROSCOPIC
Bacteria, UA: NONE SEEN
Bilirubin Urine: NEGATIVE
Glucose, UA: 50 mg/dL — AB
Ketones, ur: NEGATIVE mg/dL
Leukocytes,Ua: NEGATIVE
Nitrite: NEGATIVE
Protein, ur: 30 mg/dL — AB
Specific Gravity, Urine: 1.039 — ABNORMAL HIGH (ref 1.005–1.030)
pH: 6 (ref 5.0–8.0)

## 2020-03-14 LAB — RAPID URINE DRUG SCREEN, HOSP PERFORMED
Amphetamines: NOT DETECTED
Barbiturates: NOT DETECTED
Benzodiazepines: NOT DETECTED
Cocaine: NOT DETECTED
Opiates: NOT DETECTED
Tetrahydrocannabinol: NOT DETECTED

## 2020-03-14 LAB — TROPONIN I (HIGH SENSITIVITY)
Troponin I (High Sensitivity): 61 ng/L — ABNORMAL HIGH (ref <18)
Troponin I (High Sensitivity): 65 ng/L — ABNORMAL HIGH (ref <18)
Troponin I (High Sensitivity): 47 ng/L — ABNORMAL HIGH (ref <18)

## 2020-03-14 LAB — LACTIC ACID, PLASMA
Lactic Acid, Venous: 4.2 mmol/L (ref 0.5–1.9)
Lactic Acid, Venous: 3 mmol/L (ref 0.5–1.9)
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
## 2020-03-14 MED ORDER — ONDANSETRON HCL 4 MG/2ML IJ SOLN
4.0000 mg | Freq: Once | INTRAMUSCULAR | Status: AC
  Administered 2020-03-14: 4 mg via INTRAVENOUS
  Filled 2020-03-14: qty 2

## 2020-03-14 MED ORDER — LACTATED RINGERS IV BOLUS
1000.0000 mL | Freq: Once | INTRAVENOUS | Status: AC
  Administered 2020-03-14: 1000 mL via INTRAVENOUS

## 2020-03-14 MED ORDER — IOHEXOL 350 MG/ML SOLN
50.0000 mL | Freq: Once | INTRAVENOUS | Status: AC | PRN
  Administered 2020-03-14: 50 mL via INTRAVENOUS

## 2020-03-14 NOTE — ED Notes (Addendum)
Pt unable to stand, pt became very dizzy, diaphoretic, pt laid back down and fell back to sleep, oxygen dropped to 74% on RA, MD aware
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
## 2020-03-14 NOTE — ED Provider Notes (Signed)
CTA negative.  Pt is able to walk without difficulty.  He is stable for d/c.   Jacalyn Lefevre, MD 03/14/20 [phone]

## 2020-03-18 ENCOUNTER — Encounter: Payer: Self-pay | Admitting: Family Medicine

## 2021-09-24 ENCOUNTER — Emergency Department (HOSPITAL_COMMUNITY)
Admission: EM | Admit: 2021-09-24 | Discharge: 2021-09-25 | Disposition: A | Discharge status: Other Acute Inpatient Hospital | Payer: Self-pay | Attending: Emergency Medicine | Admitting: Emergency Medicine

## 2021-09-24 ENCOUNTER — Other Ambulatory Visit: Payer: Self-pay

## 2021-09-24 ENCOUNTER — Encounter (HOSPITAL_COMMUNITY): Payer: Self-pay | Admitting: Emergency Medicine

## 2021-09-24 DIAGNOSIS — R4182 Altered mental status, unspecified: Secondary | ICD-10-CM | POA: Insufficient documentation

## 2021-09-24 DIAGNOSIS — Z20822 Contact with and (suspected) exposure to covid-19: Secondary | ICD-10-CM | POA: Insufficient documentation

## 2021-09-24 DIAGNOSIS — R519 Headache, unspecified: Secondary | ICD-10-CM | POA: Insufficient documentation

## 2021-09-24 DIAGNOSIS — R443 Hallucinations, unspecified: Secondary | ICD-10-CM

## 2021-09-24 DIAGNOSIS — F22 Delusional disorders: Secondary | ICD-10-CM | POA: Diagnosis present

## 2021-09-24 DIAGNOSIS — R451 Restlessness and agitation: Secondary | ICD-10-CM | POA: Insufficient documentation

## 2021-09-24 DIAGNOSIS — F19951 Other psychoactive substance use, unspecified with psychoactive substance-induced psychotic disorder with hallucinations: Secondary | ICD-10-CM | POA: Diagnosis present

## 2021-09-24 DIAGNOSIS — F1590 Other stimulant use, unspecified, uncomplicated: Secondary | ICD-10-CM

## 2021-09-24 DIAGNOSIS — R441 Visual hallucinations: Secondary | ICD-10-CM | POA: Insufficient documentation

## 2021-09-24 LAB — COMPREHENSIVE METABOLIC PANEL
ALT: 51 U/L — ABNORMAL HIGH (ref 0–44)
AST: 38 U/L (ref 15–41)
Albumin: 4.7 g/dL (ref 3.5–5.0)
Alkaline Phosphatase: 86 U/L (ref 38–126)
Anion gap: 10 (ref 5–15)
BUN: 6 mg/dL (ref 6–20)
CO2: 26 mmol/L (ref 22–32)
Calcium: 9.5 mg/dL (ref 8.9–10.3)
Chloride: 102 mmol/L (ref 98–111)
Creatinine, Ser: 0.75 mg/dL (ref 0.61–1.24)
GFR, Estimated: >60 mL/min (ref >60)
Glucose, Bld: 114 mg/dL — ABNORMAL HIGH (ref 70–99)
Potassium: 4.1 mmol/L (ref 3.5–5.1)
Sodium: 138 mmol/L (ref 135–145)
Total Bilirubin: 0.5 mg/dL (ref 0.3–1.2)
Total Protein: 8 g/dL (ref 6.5–8.1)

## 2021-09-24 LAB — ETHANOL: Alcohol, Ethyl (B): <10 mg/dL (ref <10)

## 2021-09-24 LAB — CBC WITH DIFFERENTIAL/PLATELET
Abs Immature Granulocytes: 0.01 10*3/uL (ref 0.00–0.07)
Basophils Absolute: 0 10*3/uL (ref 0.0–0.1)
Basophils Relative: 1 %
Eosinophils Absolute: 0.1 10*3/uL (ref 0.0–0.5)
Eosinophils Relative: 1 %
HCT: 46.9 % (ref 39.0–52.0)
Hemoglobin: 16 g/dL (ref 13.0–17.0)
Immature Granulocytes: 0 %
Lymphocytes Relative: 39 %
Lymphs Abs: 2.9 10*3/uL (ref 0.7–4.0)
MCH: 31.7 pg (ref 26.0–34.0)
MCHC: 34.1 g/dL (ref 30.0–36.0)
MCV: 92.9 fL (ref 80.0–100.0)
Monocytes Absolute: 0.4 10*3/uL (ref 0.1–1.0)
Monocytes Relative: 6 %
Neutro Abs: 4.1 10*3/uL (ref 1.7–7.7)
Neutrophils Relative %: 53 %
Platelets: 277 10*3/uL (ref 150–400)
RBC: 5.05 MIL/uL (ref 4.22–5.81)
RDW: 11.9 % (ref 11.5–15.5)
WBC: 7.5 10*3/uL (ref 4.0–10.5)
nRBC: 0 % (ref 0.0–0.2)

## 2021-09-24 LAB — RAPID URINE DRUG SCREEN, HOSP PERFORMED
Amphetamines: POSITIVE — AB
Barbiturates: NOT DETECTED
Benzodiazepines: NOT DETECTED
Cocaine: NOT DETECTED
Opiates: NOT DETECTED
Tetrahydrocannabinol: NOT DETECTED

## 2021-09-24 NOTE — ED Provider Triage Note (Signed)
Emergency Medicine Provider Triage Evaluation Note  Mark Mills , a 41 y.o. male  was evaluated in triage.  Patient brought in by sister for evaluation of intermittent worsening confusion.  Sister states that 3 months ago the patient was hallucinating, but this seemed to resolve on its own.  Few days ago patient was complaining of some headaches, and was intermittently disoriented.  He is having difficulty identifying common objects, continues looking at the blood pressure cuff and "asking what is this?"  He states he is hallucinating but when asked what he is seeing/hearing, he states "I do not know but I am seeing it right now".  Review of Systems  Positive: Confusion, headaches, visual hallucinations Negative: Suicidal ideation, homicidal ideation  Physical Exam  BP (!) 154/95 (BP Location: Right Arm)    Pulse 100    Temp 98.8 F (37.1 C) (Oral)    Resp 16    SpO2 99%  Gen:   Awake, no distress   Resp:  Normal effort  MSK:   Moves extremities without difficulty  Other:    Medical Decision Making  Medically screening exam initiated at 11:26 AM.  Appropriate orders placed.  Saahil Halloran was informed that the remainder of the evaluation will be completed by another provider, this initial triage assessment does not replace that evaluation, and the importance of remaining in the ED until their evaluation is complete.  Will order medical screening labs, and consult TTS   Ashby Leflore T, PA-C 09/24/21 1128
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
## 2021-09-24 NOTE — ED Notes (Signed)
Pt has been pretty consistently found to be pacing the room, looking at computer or checking out cabinets.  He is not aggressive but does state that he is bored and he does seem irritated by the boredom.  He has not tried to leave to this point.  There has been no sitter since 1900.   TTS is at bedside at this time.

## 2021-09-24 NOTE — BH Assessment (Signed)
Comprehensive Clinical Assessment (CCA) Note  09/24/2021 [facility] [id number]  DISPOSITION: Quintella Reichert, NP recommends Pt be observed overnight and evaluated by psychiatry in the morning. Notified Dr. Gareth Morgan and Oretha Caprice, RN of recommendation via secure message.  The patient demonstrates the following risk factors for suicide: Chronic risk factors for suicide include: N/A. Acute risk factors for suicide include: N/A. Protective factors for this patient include: positive social support, responsibility to others (children, family), and hope for the future. Considering these factors, the overall suicide risk at this point appears to be low. Patient is appropriate for outpatient follow up.  [place] ED from 09/24/2021 in [place] No Risk      Patient is a 41 year old separated male who presents unaccompanied to [facility] coming emergency department due to concerns of confusion, visual hallucinations and paranoia. Patient brought in by sister for evaluation of intermittent and worsening confusion. Sister states patient three months ago patient was hallucinating but then stopped. Then a few days ago patient started complaining of posterior headaches and is intermittently disoriented to self, location, situation, and date. Sister states that patient 3 months ago was having visual hallucinations, but these seem to resolve on their own.  EDP reports that patient is anxious and agitated and demonstrates severe paranoia. EDP reports he continues to point out things around the room, asking "what is this?".  When we answer his questions, he states "that's what I thought", and then repeats his question. EDP reports patient is severely paranoid, and has worsening visual hallucinations and confusion. He has had multiple gaps in his timeline in the past several days, including frequently leaving the house in the middle of the night  with no memory of where he went or who he was with. The patient has a significant alcohol abuse history and cannot report to me the last time he drank. He believes the phlebotomist is taking his blood in order to put him to sleep. Pt was placed under involuntary commitment.   During assessment patient states that he came to the emergency room due to back pain. He reports that he has a history of bulging disc related to his job in [employer]. He denies that he is experiencing any mental health symptoms. He describes his mood as good. He denies problems with sleep or appetite. He denies problems with anxiety. He denies current suicidal ideation or history of suicide attempts. He denies current homicidal ideation or history of aggression. He denies experiencing auditory or visual hallucinations. Patient reports he drinks alcohol on occasion and denies drinking to excess. He denies using any substances. Patients urine drug screen is positive for amphetamines.   Patient denies use experiencing any stressors. When asked why his sister was concerned that he was experiencing visual hallucinations and confusion, patient states that his sister was confused because he had taken her car the day before and did not know where he was. He denies that he was feeling confused and indicates that this was a misunderstanding. He says he stays with his mother or his sister and says that he has good family support. He reports he and his wife are separated and that she lives in [place]. He says they have an 5 year old son and patient sees him on weekends. He denies any history of abuse or trauma. He reports a court date on February 9 due to unauthorized use of a motor vehicle which he is confident will be resolved. He denies  access to firearms. Patient reports he had brief outpatient family counselling when he and his wife separated but denies any other history inpatient or outpatient mental health treatment.   Pt is  dressed in hospital scrubs, alert and oriented x4. Pt speaks in a clear tone, at moderate volume and normal pace. Motor behavior appears normal. Eye contact is good. Pt's mood is euthymic and affect is anxious. Thought process is coherent and relevant. There is no indication Pt is currently responding to internal stimuli or experiencing delusional thought content. Pt was cooperative during assessment.   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 Family/Friend  What Is the Reason for Your Visit/Call Today? Pt's sister reports Pt has been experiencing confusion and visual hallucinations. Pt describes as being confused, agitated, and paranoid in ED. Pt say he came to [facility] for [facility] pain.  How Long Has This Been Causing You Problems? <Week  What Do You Feel Would Help You the Most Today? -- (Pt states he does not need mental health treatment.)   Have You Recently Had Any Thoughts About Hurting Yourself? No  Are You Planning to Commit Suicide/Harm Yourself At This time? No   Have you Recently Had Thoughts About Whiting? No  Are You Planning to Harm Someone at This Time? No  Explanation: No data recorded  Have You Used Any Alcohol or Drugs in the Past 24 Hours? No  How Long Ago Did You Use Drugs or Alcohol? No data recorded What Did You Use and How Much? No data recorded  Do You Currently Have a Therapist/Psychiatrist? No  Name of Therapist/Psychiatrist: No data recorded  Have You Been Recently Discharged From Any Office Practice or Programs? No  Explanation of Discharge From Practice/Program: No data recorded    CCA Screening Triage Referral Assessment Type of Contact: No data recorded Telemedicine  Service Delivery:   Is this Initial or Reassessment? No data recorded Date Telepsych consult ordered in [facility]:  No data recorded Time Telepsych consult ordered in [facility]:  No data recorded Location of Assessment: [facility]  Provider Location: [facility]   Collateral Involvement: Pt's sister   Does Patient Have a [place]? No data recorded Name and Contact of Legal Guardian: No data recorded If Minor and Not Living with Parent(s), Who has Custody? No data recorded Is CPS involved or ever been involved? Never  Is APS involved or ever been involved? Never   Patient Determined To Be At Risk for Harm To Self or Others Based on Review of Patient Reported Information or Presenting Complaint? No  Method: No data recorded Availability of Means: No data recorded Intent: No data recorded Notification Required: No data recorded Additional Information for Danger to Others Potential: No data recorded Additional Comments for Danger to Others Potential: No data recorded Are There Guns or Other Weapons in Your Home? No data recorded Types of Guns/Weapons: No data recorded Are These Weapons Safely Secured?                            No data recorded Who Could Verify You Are Able To Have These Secured: No data recorded Do You Have any Outstanding Charges, Pending Court  Dates, Parole/Probation? No data recorded Contacted To Inform of Risk of Harm To Self or Others: No data recorded   Does Patient Present under Involuntary Commitment? Yes  IVC Papers Initial File Date: 09/24/21   [place] of Residence: [place]   Patient Currently Receiving the Following Services: Not Receiving Services   Determination of Need: Urgent (48 hours)   Options For Referral: Inpatient Hospitalization; Outpatient Therapy     CCA Biopsychosocial Patient Reported Schizophrenia/Schizoaffective Diagnosis in Past: No   Strengths: Pt has family support   Mental Health  Symptoms Depression:   Irritability   Duration of Depressive symptoms:  Duration of Depressive Symptoms: Less than two weeks   Mania:   Irritability   Anxiety:    None   Psychosis:   Hallucinations   Duration of Psychotic symptoms:  Duration of Psychotic Symptoms: Less than six months   Trauma:   None   Obsessions:   None   Compulsions:   None   Inattention:   N/A   Hyperactivity/Impulsivity:   N/A   Oppositional/Defiant Behaviors:   N/A   Emotional Irregularity:   None   Other Mood/Personality Symptoms:   None    Mental Status Exam Appearance and self-care  Stature:   Average   Weight:   Average weight   Clothing:   Casual   Grooming:   Normal   Cosmetic use:   None   Posture/gait:   Normal   Motor activity:   Not Remarkable   Sensorium  Attention:   Normal   Concentration:   Anxiety interferes   Orientation:   X5   Recall/memory:   Defective in Recent   Affect and Mood  Affect:   Anxious   Mood:   Anxious   Relating  Eye contact:   Normal   Facial expression:   Responsive   Attitude toward examiner:   Cooperative   Thought and Language  Speech flow:  Normal   Thought content:   Appropriate to Mood and Circumstances   Preoccupation:   None   Hallucinations:   None   Organization:  No data recorded  [employer] of Knowledge:   Average   Intelligence:   Average   Abstraction:   Normal   Judgement:   Impaired   Reality Testing:   Variable   Insight:   Gaps   Decision Making:   Vacilates   Social Functioning  Social Maturity:   Responsible   Social Judgement:   Normal   Stress  Stressors:   Other (Comment) (Pt cannot identify any stressors)   Coping Ability:   Overwhelmed   Skill Deficits:   None   Supports:   Family     Religion: Religion/Spirituality Are You A Religious Person?: Yes What is Your Religious Affiliation?: Christian How Might This  Affect Treatment?: NA  Leisure/Recreation: Leisure / Recreation Do You Have Hobbies?: Yes Leisure and Hobbies: Watching movies, spending time with family  Exercise/Diet: Exercise/Diet Do You Exercise?: No Have You Gained or Lost A Significant Amount of Weight in the Past Six Months?: No Do You Follow a Special Diet?: No Do You Have Any Trouble Sleeping?: No   CCA Employment/Education Employment/Work Situation: Employment / Work Situation Employment Situation: Employed Work Stressors: Pt denies Patient's Job has Been Impacted by Current Illness: No Has Patient ever Been in Passenger transport manager?: No  Education: Education Is Patient Currently Attending School?: No Last Grade Completed: 12 Did You Attend College?: No Did You Have An Individualized  Education Program (IIEP): No Did You Have Any Difficulty At School?: No   CCA Family/Childhood History Family and Relationship History: Family history Marital status: Separated Does patient have children?: Yes How many children?: 1 How is patient's relationship with their children?: Good relationship with 24 year old son  Childhood History:  Childhood History By whom was/is the patient raised?: Both parents Did patient suffer any verbal/emotional/physical/sexual abuse as a child?: No Did patient suffer from severe childhood neglect?: No Has patient ever been sexually abused/assaulted/raped as an adolescent or adult?: No Was the patient ever a victim of a crime or a disaster?: No Witnessed domestic violence?: No Has patient been affected by domestic violence as an adult?: No  Child/Adolescent Assessment:     CCA Substance Use Alcohol/Drug Use: Alcohol / Drug Use Pain Medications: Denies abuse Prescriptions: Denies abuse Over the Counter: Denies abuse History of alcohol / drug use?: Yes (Pt reports drinking alcohol occasionally. UDS positive for amphetamines) Longest period of sobriety (when/how long): Unknown                          ASAM's:  Six Dimensions of Multidimensional Assessment  Dimension 1:  Acute Intoxication and/or Withdrawal Potential:      Dimension 2:  Biomedical Conditions and Complications:      Dimension 3:  Emotional, Behavioral, or Cognitive Conditions and Complications:     Dimension 4:  Readiness to Change:     Dimension 5:  Relapse, Continued use, or Continued Problem Potential:     Dimension 6:  Recovery/Living Environment:     ASAM Severity Score:    ASAM Recommended Level of Treatment:     Substance use Disorder (SUD)    Recommendations for Services/Supports/Treatments:    Discharge Disposition:    DSM5 Diagnoses: Patient Active Problem List   Diagnosis Date Noted   Somatic dysfunction of lumbar region 11/22/2011   KNEE PAIN, ACUTE 01/13/2010   WOUND, FOOT 12/23/2009   ALOPECIA AREATA 11/10/2009   LOW BACK PAIN, CHRONIC 11/10/2009     Referrals to Alternative Service(s): Referred to Alternative Service(s):   Place:   Date:   Time:    Referred to Alternative Service(s):   Place:   Date:   Time:    Referred to Alternative Service(s):   Place:   Date:   Time:    Referred to Alternative Service(s):   Place:   Date:   Time:     Evelena Peat, [facility]

## 2021-09-24 NOTE — ED Triage Notes (Signed)
Patient brought in by sister for evaluation of intermittent and worsening confusion. Sister states patient three months ago patient was hallucinating but then stopped. Then a few days ago patient started complaining of posterior headaches and is intermittently disoriented to self, location, situation, and date. Patient denies SI/HI. Patient is alert but confused, unable to identify common objects in the room, looking at the blood pressure cuff and turning it over multiple times asking "what is this?".

## 2021-09-24 NOTE — ED Provider Notes (Signed)
[facility] EMERGENCY DEPARTMENT Provider Note   CSN: [id number] Arrival date & time: 09/24/21  1114     History  Chief Complaint  Patient presents with   Altered Mental Status    Mark Mills is a 41 y.o. male who is brought in by his sister to the emergency department for evaluation of worsening confusion and hallucinations.  Sister states that patient 3 months ago was having visual hallucinations, but these seem to resolve on their own.  Several days ago patient started complaining of posterior headaches, and he had intermittent confusion including disorientation to self, location, situation, and date.  Patient adamantly denies suicidal or homicidal ideation, but sister states that he has been increasingly reckless and had some head trauma the other day.  Sister also reports patient has a significant alcohol abuse history, and has been in the hospital before for both overdoses and withdrawals.  He is unable to tell me the last time that he drank.  Level 5 caveat due to altered mental status   Altered Mental Status Presenting symptoms: confusion   Associated symptoms: agitation     History reviewed. No pertinent past medical history.   Home Medications Prior to Admission medications   Not on File      Allergies    Patient has no known allergies.    Review of Systems   Review of Systems  Unable to perform ROS: Mental status change  Psychiatric/Behavioral:  Positive for agitation, behavioral problems and confusion. The patient is nervous/anxious and is hyperactive.   All other systems reviewed and are negative.  Physical Exam Updated Vital Signs BP (!) 154/95 (BP Location: Right Arm)    Pulse 100    Temp 98.8 F (37.1 C) (Oral)    Resp 16    SpO2 99%  Physical Exam Vitals and nursing note reviewed.  Constitutional:      Appearance: Normal appearance.  HENT:     Head: Normocephalic and atraumatic.  Eyes:     Conjunctiva/sclera: Conjunctivae  normal.  Pulmonary:     Effort: Pulmonary effort is normal. No respiratory distress.  Skin:    General: Skin is warm and dry.  Neurological:     Mental Status: He is alert.  Psychiatric:        Attention and Perception: He is inattentive. He perceives visual hallucinations.        Mood and Affect: Mood is anxious. Affect is labile.        Speech: Speech is rapid and pressured.        Behavior: Behavior is uncooperative, agitated and hyperactive.        Thought Content: Thought content is paranoid. Thought content does not include homicidal or suicidal ideation. Thought content does not include homicidal or suicidal plan.        Cognition and Memory: He exhibits impaired recent memory.        Judgment: Judgment is impulsive and inappropriate.     Comments: Patient continues to pace around his room, and walk out into the hallway.  I have difficulty redirecting him.  He believes that the phlebotomist is trying to take his blood in order to put him to sleep.  He continues to explain that he will come back for blood work after he is able to see his son, but cannot clearly answer why he is here.    ED Results / Procedures / Treatments   Labs (all labs ordered are listed, but only abnormal results are displayed)  Labs Reviewed  COMPREHENSIVE METABOLIC PANEL - Abnormal; Notable for the following components:      Result Value   Glucose, Bld 114 (*)    ALT 51 (*)    All other components within normal limits  RAPID URINE DRUG SCREEN, HOSP PERFORMED - Abnormal; Notable for the following components:   Amphetamines POSITIVE (*)    All other components within normal limits  RESP PANEL BY RT-PCR (FLU A&B, COVID) [id number]  ETHANOL  CBC WITH DIFFERENTIAL/PLATELET  CBG MONITORING, ED    EKG None  Radiology No results found.  Procedures Procedures    Medications Ordered in ED Medications - No data to display  ED Course/ Medical Decision Making/ A&P                           Medical  Decision Making Amount and/or Complexity of Data Reviewed Labs: ordered.  Patient is a 41 y/o male who presents to the emergency department for altered mental status. Patient has history of hallucinations and severe alcohol abuse.  On my exam patient is anxious and agitated, and demonstrates severe paranoia. He continues to point out things around the room and asking myself and the sister "what is this?".  When we answer his questions, he states "that's what I thought", and then repeats his question.   Patient is severely paranoid, and has worsening visual hallucinations and confusion. He has multiple gaps in his timeline in the past several days, including frequently leaving the house in the middle of the night with no memory of where he went or who he was with. Patient has a significant alcohol abuse history, and cannot report to me the last time he drank. He believes the phlebotomist is taking his blood in order to put him to sleep.  The sister is wanting to have him involuntarily committed today for mental health and substance abuse evaluation, and in my clinical opinion I believe this is necessary. I believe he is a danger to himself and others as he clearly does not have capacity to make medical decisions.   Medical clearance labs obtained and consult to TTS placed. Patient is medically cleared at this time. Labs overall unremarkable except urine drug screen positive for amphetamines. Involuntary commitment ordered taken out by myself and attending physician [facility] Mills, who has cosigned this note.   7:00pm Patient has not yet been evaluated by TTS. Care transferred to default provider and attending physician Dr. Billy Mills made aware.   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 Every  effort was made to ensure accuracy; however, inadvertent computerized transcription errors may be present.    Mark Plummer, PA-C 09/24/21 [place], [place], DO 09/24/21 1956

## 2021-09-24 NOTE — ED Notes (Signed)
Pt is very resistant to changing into burgundy scrubs & family is not leaving when being asked. After [facility] encouragement pt stated he will change after he uses the restroom & the mother & sister stated they will say their "goodbyes."

## 2021-09-24 NOTE — ED Notes (Signed)
Needs urine sample clicked off accidentally

## 2021-09-24 NOTE — ED Notes (Signed)
Pt allowed me to get vitals and a Covid swab with some explanation.  I asked him if he wanted something to help him "sleep".  He said no.

## 2021-09-25 ENCOUNTER — Encounter (HOSPITAL_COMMUNITY): Payer: Self-pay | Admitting: Registered Nurse

## 2021-09-25 ENCOUNTER — Ambulatory Visit (HOSPITAL_COMMUNITY)
Admission: EM | Admit: 2021-09-25 | Discharge: 2021-09-26 | Disposition: A | Discharge status: Home / Self Care | Payer: No Payment, Other | Attending: Behavioral Health | Admitting: Behavioral Health

## 2021-09-25 DIAGNOSIS — F1590 Other stimulant use, unspecified, uncomplicated: Secondary | ICD-10-CM | POA: Insufficient documentation

## 2021-09-25 DIAGNOSIS — R443 Hallucinations, unspecified: Secondary | ICD-10-CM

## 2021-09-25 DIAGNOSIS — R410 Disorientation, unspecified: Secondary | ICD-10-CM | POA: Insufficient documentation

## 2021-09-25 DIAGNOSIS — F19951 Other psychoactive substance use, unspecified with psychoactive substance-induced psychotic disorder with hallucinations: Secondary | ICD-10-CM

## 2021-09-25 DIAGNOSIS — F22 Delusional disorders: Secondary | ICD-10-CM

## 2021-09-25 LAB — LIPID PANEL
Cholesterol: 223 mg/dL — ABNORMAL HIGH (ref 0–200)
HDL: 68 mg/dL (ref >40)
LDL Cholesterol: 139 mg/dL — ABNORMAL HIGH (ref 0–99)
Total CHOL/HDL Ratio: 3.3 RATIO
Triglycerides: 80 mg/dL (ref <150)
VLDL: 16 mg/dL (ref 0–40)

## 2021-09-25 LAB — TSH: TSH: 2.991 u[IU]/mL (ref 0.350–4.500)

## 2021-09-25 LAB — RESP PANEL BY RT-PCR (FLU A&B, COVID) ARPGX2
Influenza A by PCR: NEGATIVE
Influenza B by PCR: NEGATIVE
SARS Coronavirus 2 by RT PCR: NEGATIVE

## 2021-09-25 MED ORDER — ACETAMINOPHEN 325 MG PO TABS: 650.0000 mg | ORAL_TABLET | Freq: Four times a day (QID) | ORAL | Status: DC | PRN

## 2021-09-25 MED ORDER — HYDROXYZINE HCL 25 MG PO TABS
25.0000 mg | ORAL_TABLET | Freq: Three times a day (TID) | ORAL | Status: DC | PRN
  Administered 2021-09-26: 25 mg via ORAL
  Filled 2021-09-25: qty 1

## 2021-09-25 MED ORDER — MAGNESIUM HYDROXIDE 400 MG/5ML PO SUSP: 30.0000 mL | Freq: Every day | ORAL | Status: DC | PRN

## 2021-09-25 MED ORDER — ALUM & MAG HYDROXIDE-SIMETH 200-200-20 MG/5ML PO SUSP: 30.0000 mL | ORAL | Status: DC | PRN

## 2021-09-25 MED ORDER — OLANZAPINE 5 MG PO TBDP
5.0000 mg | ORAL_TABLET | Freq: Every day | ORAL | Status: DC
  Administered 2021-09-25: 5 mg via ORAL
  Filled 2021-09-25: qty 1

## 2021-09-25 MED ORDER — TRAZODONE HCL 50 MG PO TABS: 50.0000 mg | ORAL_TABLET | Freq: Every evening | ORAL | Status: DC | PRN

## 2021-09-25 NOTE — ED Notes (Addendum)
Pt admitted to [facility] from [facility]. Pt is ambulatory, calm, and cooperative during RN assessment. Skin assessment complete and witnessed by Ada, MHT. Approximately 1/2 inch scratch on top of head, otherwise skin is WNL. Pt denies SI/HI, AVH, and any pain. Pt was oriented to the unit, given dinner, and is currently resting on his chair/bed.

## 2021-09-25 NOTE — ED Notes (Signed)
[employer] warm decaffeinated tea given to pt per pt request for tea

## 2021-09-25 NOTE — ED Notes (Signed)
Pt provided shower and phone call.  First phone call complete.

## 2021-09-25 NOTE — ED Provider Notes (Signed)
Emergency Medicine Observation Re-evaluation Note  Mark Mills is a 41 y.o. male, seen on rounds today.  Pt initially presented to the ED for complaints of Altered Mental Status Currently, the patient is resting comfortably talking to a technician.  Physical Exam  BP (!) 141/92 (BP Location: Right Arm)    Pulse 85    Temp 99.3 F (37.4 C) (Oral)    Resp 14    SpO2 100%  Physical Exam General: Nontoxic appearance Cardiac: Normal heart rate Lungs: Normal respiratory rate Psych: Not responding to internal stimuli  ED Course / MDM  EKG:EKG Interpretation  Date/Time:  Friday September 24 2021 11:23:20 EST Ventricular Rate:  109 PR Interval:  124 QRS Duration: 86 QT Interval:  340 QTC Calculation: 457 R Axis:   103 Text Interpretation: Sinus tachycardia Rightward axis Biventricular hypertrophy T wave abnormality, consider inferior ischemia Abnormal ECG No previous ECGs available Confirmed by Alvira Monday ([id number]) on 09/24/2021 11:32:34 PM  I have reviewed the labs performed to date as well as medications administered while in observation.  Recent changes in the last 24 hours include he has been cooperative.  Plan  Current plan is for transfer for psychiatric care. [facility] is under involuntary commitment.  EMTALA form.      Mancel Bale, MD 09/25/21 2249

## 2021-09-25 NOTE — ED Notes (Signed)
Called to arrange transport to [facility] with [employer]

## 2021-09-25 NOTE — ED Notes (Signed)
[facility] called to collect STAT specimen to deliver to [facility].

## 2021-09-25 NOTE — ED Provider Notes (Signed)
Behavioral Health Admission H&P [facility] & OBS)  Date: 09/25/21 Patient Name: Mark Mills MRN: [id number]  Diagnoses:  Final diagnoses:  Hallucinations  Amphetamine use    HPI: Mark Mills is a 41 y.o. male patient admitted to [facility] ED after presenting with his sister and complaints of confusion, paranoia, and hallucinations. Patient was transferred to the [facility] for continuous assessment. Patient is under IVC.  On evaluation, patient is lying down in bed with his eyes closed and in no acute distress. Patient is awakened to complete assessment. He states that he is sleepy but will try to answer questions.  He denies suicidal ideations. He denies homicidal ideations. When asked if he is experiencing auditory or visual hallucinations, he states "so far no." When asked if he was experiencing hallucinations yesterday he states, no. There is no objective evidence that the patient is currently responding to internal or external stimuli. When asked if he knows why he was brought to the emergency department, he states "my sister and mother were concerned, I do not know why."  When asked if he uses drugs or alcohol, he states sometimes alcohol and crystal meth. He does not further elaborate. He then apologizes and states that he is tired.   PHQ 2-9:   Flowsheet Row ED from 09/25/2021 in [facility] ED from 09/24/2021 in [facility] EMERGENCY DEPARTMENT  C-SSRS RISK CATEGORY No Risk No Risk        Total Time spent with patient: 20 minutes  Musculoskeletal  Strength & Muscle Tone: within normal limits Gait & Station: normal Patient leans: N/A  Psychiatric Specialty Exam  Presentation General Appearance: Appropriate for Environment  Eye Contact:Fair  Speech:Clear and Coherent  Speech Volume:Normal  Handedness:Right   Mood and Affect  Mood:Anxious  Affect:Congruent   Thought Process  Thought Processes:Coherent  Descriptions of  Associations:Intact  Orientation:Full (Time, Place and Person)  Thought Content:Logical  Diagnosis of Schizophrenia or Schizoaffective disorder in past: No   Hallucinations:Hallucinations: None  Ideas of Reference:None  Suicidal Thoughts:Suicidal Thoughts: No  Homicidal Thoughts:Homicidal Thoughts: No   Sensorium  Memory:Immediate Fair; Recent Fair; Remote Fair  Judgment:Intact  Insight:Present   Executive Functions  Concentration:Fair  Attention Span:Fair  Recall:Fair  Fund of Knowledge:Fair  Language:Fair   Psychomotor Activity  Psychomotor Activity:Psychomotor Activity: Normal   Assets  Assets:Communication Skills; Desire for Improvement; Housing; Physical Health; Leisure Time; Social Support   Sleep  Sleep:Sleep: Fair   Nutritional Assessment (For OBS and [facility] admissions only) Has the patient had a weight loss or gain of 10 pounds or more in the last 3 months?: No Has the patient had a decrease in food intake/or appetite?: No Does the patient have dental problems?: No Does the patient have eating habits or behaviors that may be indicators of an eating disorder including binging or inducing vomiting?: No Has the patient been eating poorly because of a decreased appetite?: 0    Physical Exam Constitutional:      Appearance: Normal appearance.  HENT:     Head: Normocephalic and atraumatic.     Nose: Nose normal.  Eyes:     Conjunctiva/sclera: Conjunctivae normal.  Musculoskeletal:        General: Normal range of motion.  Neurological:     Mental Status: He is alert and oriented to person, place, and time.   Review of Systems  Constitutional: Negative.   HENT: Negative.    Eyes: Negative.   Respiratory: Negative.    Cardiovascular: Negative.  Gastrointestinal: Negative.   Genitourinary: Negative.   Musculoskeletal: Negative.   Skin: Negative.   Neurological: Negative.   Endo/Heme/Allergies: Negative.    Blood pressure (!) 134/98,  pulse 80, temperature 98 F (36.7 C), temperature source Oral, resp. rate 18, SpO2 100 %. There is no height or weight on file to calculate BMI.  Past Psychiatric History: No past psych hx reported   Is the patient at risk to self? No  Has the patient been a risk to self in the past 6 months? No .    Has the patient been a risk to self within the distant past? No   Is the patient a risk to others? No   Has the patient been a risk to others in the past 6 months? No   Has the patient been a risk to others within the distant past? No   Past Medical History: No past medical history on file.  Past Surgical History:  Procedure Laterality Date   HERNIA REPAIR      Family History: No hx reported   Social History:  Social History   Socioeconomic History   Marital status: Single    Spouse name: Not on file   Number of children: Not on file   Years of education: Not on file   Highest education level: Not on file  Occupational History   Not on file  Tobacco Use   Smoking status: Never   Smokeless tobacco: Not on file  Substance and Sexual Activity   Alcohol use: Yes   Drug use: Not on file   Sexual activity: Not on file  Other Topics Concern   Not on file  Social History Narrative   ** Merged History Encounter **       ** Merged History Encounter **       Social Determinants of Health   Financial Resource Strain: Not on file  Food Insecurity: Not on file  Transportation Needs: Not on file  Physical Activity: Not on file  Stress: Not on file  Social Connections: Not on file  Intimate Partner Violence: Not on file    SDOH:  SDOH Screenings   Alcohol Screen: Not on file  Depression (PHQ2-9): Not on file  Financial Resource Strain: Not on file  Food Insecurity: Not on file  Housing: Not on file  Physical Activity: Not on file  Social Connections: Not on file  Stress: Not on file  Tobacco Use: Unknown   Smoking Tobacco Use: Never   Smokeless Tobacco Use: Unknown    Passive Exposure: Not on file  Transportation Needs: Not on file    Last Labs:  Admission on 09/24/2021, Discharged on 09/25/2021  Component Date Value Ref Range Status   SARS Coronavirus 2 by RT PCR 09/24/2021 NEGATIVE  NEGATIVE Final   Comment: (NOTE) SARS-CoV-2 target nucleic acids are NOT DETECTED.  The SARS-CoV-2 RNA is generally detectable in upper respiratory specimens during the acute phase of infection. The lowest concentration of SARS-CoV-2 viral copies this assay can detect is 138 copies/mL. A negative result does not preclude SARS-Cov-2 infection and should not be used as the sole basis for treatment or other patient management decisions. A negative result may occur with  improper specimen collection/handling, submission of specimen other than nasopharyngeal swab, presence of viral mutation(s) within the areas targeted by this assay, and inadequate number of viral copies(<138 copies/mL). A negative result must be combined with clinical observations, patient history, and epidemiological information. The expected result is Negative.  Fact Sheet for Patients:  BloggerCourse.comhttps://www.fda.gov/media/152166/download  Fact Sheet for Healthcare Providers:  SeriousBroker.ithttps://www.fda.gov/media/152162/download  This test is no                          t yet approved or cleared by the [place] FDA and  has been authorized for detection and/or diagnosis of SARS-CoV-2 by FDA under an Emergency Use Authorization (EUA). This EUA will remain  in effect (meaning this test can be used) for the duration of the COVID-19 declaration under Section 564(b)(1) of the Act, 21 U.S.C.section 360bbb-3(b)(1), unless the authorization is terminated  or revoked sooner.       Influenza A by PCR 09/24/2021 NEGATIVE  NEGATIVE Final   Influenza B by PCR 09/24/2021 NEGATIVE  NEGATIVE Final   Comment: (NOTE) The Xpert Xpress SARS-CoV-2/FLU/RSV plus assay is intended as an aid in the diagnosis of influenza from  Nasopharyngeal swab specimens and should not be used as a sole basis for treatment. Nasal washings and aspirates are unacceptable for Xpert Xpress SARS-CoV-2/FLU/RSV testing.  Fact Sheet for Patients: BloggerCourse.comhttps://www.fda.gov/media/152166/download  Fact Sheet for Healthcare Providers: SeriousBroker.ithttps://www.fda.gov/media/152162/download  This test is not yet approved or cleared by the [place] FDA and has been authorized for detection and/or diagnosis of SARS-CoV-2 by FDA under an Emergency Use Authorization (EUA). This EUA will remain in effect (meaning this test can be used) for the duration of the COVID-19 declaration under Section 564(b)(1) of the Act, 21 U.S.C. section 360bbb-3(b)(1), unless the authorization is terminated or revoked.  Performed at [facility], [address] [place], [place] [address]    Sodium 09/24/2021 138  135 - 145 mmol/L Final   Potassium 09/24/2021 4.1  3.5 - 5.1 mmol/L Final   Chloride 09/24/2021 102  98 - 111 mmol/L Final   CO2 09/24/2021 26  22 - 32 mmol/L Final   Glucose, Bld 09/24/2021 114 (H)  70 - 99 mg/dL Final   Glucose reference range applies only to samples taken after fasting for at least 8 hours.   BUN 09/24/2021 6  6 - 20 mg/dL Final   Creatinine, Ser 09/24/2021 0.75  0.61 - 1.24 mg/dL Final   Calcium 19/14/782901/20/2023 9.5  8.9 - 10.3 mg/dL Final   Total Protein 56/21/308601/20/2023 8.0  6.5 - 8.1 g/dL Final   Albumin 57/84/696201/20/2023 4.7  3.5 - 5.0 g/dL Final   AST 95/28/413201/20/2023 38  15 - 41 U/L Final   ALT 09/24/2021 51 (H)  0 - 44 U/L Final   Alkaline Phosphatase 09/24/2021 86  38 - 126 U/L Final   Total Bilirubin 09/24/2021 0.5  0.3 - 1.2 mg/dL Final   GFR, Estimated 09/24/2021 >60  >60 mL/min Final   Comment: (NOTE) Calculated using the CKD-EPI Creatinine Equation (2021)    Anion gap 09/24/2021 10  5 - 15 Final   Performed at [facility], [address] [place], [place] [address]   Alcohol, Ethyl (B) 09/24/2021 <10  <10 mg/dL Final   Comment:  (NOTE) Lowest detectable limit for serum alcohol is 10 mg/dL.  For medical purposes only. Performed at [facility], [address] [place], [place] [address]    Opiates 09/24/2021 NONE DETECTED  NONE DETECTED Final   Cocaine 09/24/2021 NONE DETECTED  NONE DETECTED Final   Benzodiazepines 09/24/2021 NONE DETECTED  NONE DETECTED Final   Amphetamines 09/24/2021 POSITIVE (A)  NONE DETECTED Final   Tetrahydrocannabinol 09/24/2021 NONE DETECTED  NONE DETECTED Final  Barbiturates 09/24/2021 NONE DETECTED  NONE DETECTED Final   Comment: (NOTE) DRUG SCREEN FOR MEDICAL PURPOSES ONLY.  IF CONFIRMATION IS NEEDED FOR ANY PURPOSE, NOTIFY LAB WITHIN 5 DAYS.  LOWEST DETECTABLE LIMITS FOR URINE DRUG SCREEN Drug Class                     Cutoff (ng/mL) Amphetamine and metabolites    1000 Barbiturate and metabolites    200 Benzodiazepine                 200 Tricyclics and metabolites     300 Opiates and metabolites        300 Cocaine and metabolites        300 THC                            50 Performed at [facility], [address] [place], [place] [address]    WBC 09/24/2021 7.5  4.0 - 10.5 K/uL Final   RBC 09/24/2021 5.05  4.22 - 5.81 MIL/uL Final   Hemoglobin 09/24/2021 16.0  13.0 - 17.0 g/dL Final   HCT 29/56/213001/20/2023 46.9  39.0 - 52.0 % Final   MCV 09/24/2021 92.9  80.0 - 100.0 fL Final   MCH 09/24/2021 31.7  26.0 - 34.0 pg Final   MCHC 09/24/2021 34.1  30.0 - 36.0 g/dL Final   RDW 86/57/846901/20/2023 11.9  11.5 - 15.5 % Final   Platelets 09/24/2021 277  150 - 400 K/uL Final   nRBC 09/24/2021 0.0  0.0 - 0.2 % Final   Neutrophils Relative % 09/24/2021 53  % Final   Neutro Abs 09/24/2021 4.1  1.7 - 7.7 K/uL Final   Lymphocytes Relative 09/24/2021 39  % Final   Lymphs Abs 09/24/2021 2.9  0.7 - 4.0 K/uL Final   Monocytes Relative 09/24/2021 6  % Final   Monocytes Absolute 09/24/2021 0.4  0.1 - 1.0 K/uL Final   Eosinophils Relative 09/24/2021 1  % Final   Eosinophils Absolute  09/24/2021 0.1  0.0 - 0.5 K/uL Final   Basophils Relative 09/24/2021 1  % Final   Basophils Absolute 09/24/2021 0.0  0.0 - 0.1 K/uL Final   Immature Granulocytes 09/24/2021 0  % Final   Abs Immature Granulocytes 09/24/2021 0.01  0.00 - 0.07 K/uL Final   Performed at [facility], [address] [place], [place] [address]    Allergies: Patient has no known allergies.  PTA Medications: (Not in a hospital admission)   Medical Decision Making  [facility] continuous assessment for observation and safety.  Medications:  Start Vistaril 25 mg po as needed for anxiety Start trazodone 50 mg po as needed for sleep  Ordering labs: A1c, lipid panel, TSH  Recommendations  Based on my evaluation the patient does not appear to have an emergency medical condition.  Layla BarterWhite, Alethea Terhaar L, NP 09/25/21  6:59 PM

## 2021-09-25 NOTE — ED Notes (Signed)
Pt is in the bed sleeping at present. Respirations are even and unlabored. No acute distress noted. Will continue to monitor for safety. °

## 2021-09-25 NOTE — ED Notes (Signed)
Pt is sleeping in the bed. Respirations are even and unlabored. No acute distress noted. Will continue to monitor for safety. °

## 2021-09-25 NOTE — ED Notes (Signed)
Patient arrived on unit.meal given. Patient calm and cooperative.

## 2021-09-25 NOTE — ED Notes (Signed)
TTS in progress 

## 2021-09-25 NOTE — ED Notes (Signed)
Sister Byrd Hesselbach [phone] would like an update and to talk to her brother

## 2021-09-25 NOTE — Consult Note (Signed)
Telepsych Consultation   Reason for Consult:  Psychosis and paranoia Referring Physician:  Jeanella Flattery Location of Patient: [facility] ED Location of Provider: Other: Home Office  Patient Identification: Mark Mills MRN:  [id number] Principal Diagnosis: Substance-induced psychotic disorder with hallucinations (HCC) Diagnosis:  Principal Problem:   Substance-induced psychotic disorder with hallucinations (HCC) Active Problems:   Paranoid psychosis (HCC)   Amphetamine use   Total Time spent with patient: 30 minutes  Subjective:   Mark Mills is a 41 y.o. male patient admitted to [facility] ED after presenting with his sister and complaints of confusion, paranoia, and hallucinations  HPI:  Mark Mills, 41 y.o., male patient seen via tele health by this provider, consulted with Dr. Margaretha Seeds; and chart reviewed on 09/25/21.  On evaluation Mark Mills reports he was brought to the emergency room by his sister.  States he is not sure why.  He reports that he feels good this morning and denies suicidal/self-harm/homicidal ideation, psychosis, paranoia.  Patient denies any prior psychiatric history.  Patient also denies any illicit drug use.  Patient informed that his urine drug screen was positive for amphetamines.  Patient states he does not know how to get in the system he denies the use of any stimulants or meth.  Patient reports that he is living with his mother and sister.  Reports he is working in [employer].  Patient reports he also has a court date February 9 related to a failure to appear failure to appear. During evaluation Mark Mills is sitting upright in chair  in no acute distress.  He is alert/oriented x 4; calm/cooperative; and his mood congruent with affect.  He is speaking in a clear tone at moderate volume, and normal pace; with good eye contact.  He thought process is coherent and relevant; There is no indication that he is currently responding to  internal/external stimuli or experiencing delusional thought content; and he denies suicidal/self-harm/homicidal ideation, psychosis, and paranoia.  Patient has remained calm throughout assessment and has answered questions appropriately.  Patient gave permission to speak to his mother or his sister for collateral information.  Collateral Information:  Mark Mills (patients sister) at [phone].  Patient's sister admits the patient told her that he had used some drugs while he was in hospital before she left yesterday patient's is also reports that patient had cleared up a little but then began acting out and acting paranoid before she left the hospital yesterday.  Reports she has concerns with patient coming back to her home having hallucinations and paranoia related to her children being in a house.  Reports she would like for patient to stay another night on the medication that is given to help him with the hallucinations before he comes back into her home.   Past Psychiatric History: Patient denies prior psychiatric history  Risk to Self: Denies Risk to Others: Denies Prior Inpatient Therapy: Denies Prior Outpatient Therapy: Denies  Past Medical History: History reviewed. No pertinent past medical history.  Past Surgical History:  Procedure Laterality Date   HERNIA REPAIR     Family History: History reviewed. No pertinent family history. Family Psychiatric  History: None reported Social History:  Social History   Substance and Sexual Activity  Alcohol Use Yes     Social History   Substance and Sexual Activity  Drug Use Not on file    Social History   Socioeconomic History   Marital status: Single    Spouse name: Not on file  Number of children: Not on file   Years of education: Not on file   Highest education level: Not on file  Occupational History   Not on file  Tobacco Use   Smoking status: Never   Smokeless tobacco: Not on file  Substance and Sexual Activity    Alcohol use: Yes   Drug use: Not on file   Sexual activity: Not on file  Other Topics Concern   Not on file  Social History Narrative   ** Merged History Encounter **       ** Merged History Encounter **       Social Determinants of Health   Financial Resource Strain: Not on file  Food Insecurity: Not on file  Transportation Needs: Not on file  Physical Activity: Not on file  Stress: Not on file  Social Connections: Not on file   Additional Social History:    Allergies:  No Known Allergies  Labs:  Results for orders placed or performed during the hospital encounter of 09/24/21 (from the past 48 hour(s))  Urine rapid drug screen (hosp performed)     Status: Abnormal   Collection Time: 09/24/21 11:26 AM  Result Value Ref Range   Opiates NONE DETECTED NONE DETECTED   Cocaine NONE DETECTED NONE DETECTED   Benzodiazepines NONE DETECTED NONE DETECTED   Amphetamines POSITIVE (A) NONE DETECTED   Tetrahydrocannabinol NONE DETECTED NONE DETECTED   Barbiturates NONE DETECTED NONE DETECTED    Comment: (NOTE) DRUG SCREEN FOR MEDICAL PURPOSES ONLY.  IF CONFIRMATION IS NEEDED FOR ANY PURPOSE, NOTIFY LAB WITHIN 5 DAYS.  LOWEST DETECTABLE LIMITS FOR URINE DRUG SCREEN Drug Class                     Cutoff (ng/mL) Amphetamine and metabolites    1000 Barbiturate and metabolites    200 Benzodiazepine                 200 Tricyclics and metabolites     300 Opiates and metabolites        300 Cocaine and metabolites        300 THC                            50 Performed at [facility], [address], [place], [place] [address]   Comprehensive metabolic panel     Status: Abnormal   Collection Time: 09/24/21 11:59 AM  Result Value Ref Range   Sodium 138 135 - 145 mmol/L   Potassium 4.1 3.5 - 5.1 mmol/L   Chloride 102 98 - 111 mmol/L   CO2 26 22 - 32 mmol/L   Glucose, Bld 114 (H) 70 - 99 mg/dL    Comment: Glucose reference range applies only to samples taken after  fasting for at least 8 hours.   BUN 6 6 - 20 mg/dL   Creatinine, Ser 2.77 0.61 - 1.24 mg/dL   Calcium 9.5 8.9 - 41.2 mg/dL   Total Protein 8.0 6.5 - 8.1 g/dL   Albumin 4.7 3.5 - 5.0 g/dL   AST 38 15 - 41 U/L   ALT 51 (H) 0 - 44 U/L   Alkaline Phosphatase 86 38 - 126 U/L   Total Bilirubin 0.5 0.3 - 1.2 mg/dL   GFR, Estimated >87 >86 mL/min    Comment: (NOTE) Calculated using the CKD-EPI Creatinine Equation (2021)    Anion gap 10 5 - 15  Comment: Performed at [facility], [place] [address] [place], [place] [address]  Ethanol     Status: None   Collection Time: 09/24/21 11:59 AM  Result Value Ref Range   Alcohol, Ethyl (B) <10 <10 mg/dL    Comment: (NOTE) Lowest detectable limit for serum alcohol is 10 mg/dL.  For medical purposes only. Performed at [facility], [place] [address], [place], [place] [address]   CBC with Diff     Status: None   Collection Time: 09/24/21 11:59 AM  Result Value Ref Range   WBC 7.5 4.0 - 10.5 K/uL   RBC 5.05 4.22 - 5.81 MIL/uL   Hemoglobin 16.0 13.0 - 17.0 g/dL   HCT 46.9 39.0 - 52.0 %   MCV 92.9 80.0 - 100.0 fL   MCH 31.7 26.0 - 34.0 pg   MCHC 34.1 30.0 - 36.0 g/dL   RDW 11.9 11.5 - 15.5 %   Platelets 277 150 - 400 K/uL   nRBC 0.0 0.0 - 0.2 %   Neutrophils Relative % 53 %   Neutro Abs 4.1 1.7 - 7.7 K/uL   Lymphocytes Relative 39 %   Lymphs Abs 2.9 0.7 - 4.0 K/uL   Monocytes Relative 6 %   Monocytes Absolute 0.4 0.1 - 1.0 K/uL   Eosinophils Relative 1 %   Eosinophils Absolute 0.1 0.0 - 0.5 K/uL   Basophils Relative 1 %   Basophils Absolute 0.0 0.0 - 0.1 K/uL   Immature Granulocytes 0 %   Abs Immature Granulocytes 0.01 0.00 - 0.07 K/uL    Comment: Performed at [facility], [address], [place], [place] [address]  Resp Panel by RT-PCR (Flu A&B, Covid) Nasopharyngeal Swab     Status: None   Collection Time: 09/24/21 11:30 PM   Specimen: Nasopharyngeal Swab; Nasopharyngeal(NP) swabs in vial transport medium  Result  Value Ref Range   SARS Coronavirus 2 by RT PCR NEGATIVE NEGATIVE    Comment: (NOTE) SARS-CoV-2 target nucleic acids are NOT DETECTED.  The SARS-CoV-2 RNA is generally detectable in upper respiratory specimens during the acute phase of infection. The lowest concentration of SARS-CoV-2 viral copies this assay can detect is 138 copies/mL. A negative result does not preclude SARS-Cov-2 infection and should not be used as the sole basis for treatment or other patient management decisions. A negative result may occur with  improper specimen collection/handling, submission of specimen other Mills nasopharyngeal swab, presence of viral mutation(s) within the areas targeted by this assay, and inadequate number of viral copies(<138 copies/mL). A negative result must be combined with clinical observations, patient history, and epidemiological information. The expected result is Negative.  Fact Sheet for Patients:  EntrepreneurPulse.com.au  Fact Sheet for Healthcare Providers:  IncredibleEmployment.be  This test is no t yet approved or cleared by the [place] FDA and  has been authorized for detection and/or diagnosis of SARS-CoV-2 by FDA under an Emergency Use Authorization (EUA). This EUA will remain  in effect (meaning this test can be used) for the duration of the COVID-19 declaration under Section 564(b)(1) of the Act, 21 U.S.C.section 360bbb-3(b)(1), unless the authorization is terminated  or revoked sooner.       Influenza A by PCR NEGATIVE NEGATIVE   Influenza B by PCR NEGATIVE NEGATIVE    Comment: (NOTE) The Xpert Xpress SARS-CoV-2/FLU/RSV plus assay is intended as an aid in the diagnosis of influenza from Nasopharyngeal swab specimens and should not be used as a sole basis for treatment.  Nasal washings and aspirates are unacceptable for Xpert Xpress SARS-CoV-2/FLU/RSV testing.  Fact Sheet for  Patients: EntrepreneurPulse.com.au  Fact Sheet for Healthcare Providers: IncredibleEmployment.be  This test is not yet approved or cleared by the [place] FDA and has been authorized for detection and/or diagnosis of SARS-CoV-2 by FDA under an Emergency Use Authorization (EUA). This EUA will remain in effect (meaning this test can be used) for the duration of the COVID-19 declaration under Section 564(b)(1) of the Act, 21 U.S.C. section 360bbb-3(b)(1), unless the authorization is terminated or revoked.  Performed at [facility], [place] [address], [place], [place] [address]     Medications:  No current facility-administered medications for this encounter.   No current outpatient medications on file.    Musculoskeletal: Strength & Muscle Tone: within normal limits Gait & Station: normal Patient leans: N/A          Psychiatric Specialty Exam:  Presentation  General Appearance: Appropriate for Environment  Eye Contact:Good  Speech:Clear and Coherent; Normal Rate  Speech Volume:Normal  Handedness:Right   Mood and Affect  Mood:Anxious  Affect:Congruent   Thought Process  Thought Processes:Coherent; Goal Directed  Descriptions of Associations:Intact  Orientation:Full (Time, Place and Person)  Thought Content:Logical  History of Schizophrenia/Schizoaffective disorder:No  Duration of Psychotic Symptoms:N/A  Hallucinations:Hallucinations: None  Ideas of Reference:None  Suicidal Thoughts:Suicidal Thoughts: No  Homicidal Thoughts:Homicidal Thoughts: No   Sensorium  Memory:Immediate Good; Recent Fair  Judgment:Intact  Insight:Present   Executive Functions  Concentration:Good  Attention Span:Good  [place] of Knowledge:Good  Language:Good   Psychomotor Activity  Psychomotor Activity:Psychomotor Activity: Normal   Assets  Assets:Communication Skills; Desire for Improvement;  Housing; Social Support   Sleep  Sleep:Sleep: Good    Physical Exam: Physical Exam Vitals and nursing note reviewed. Exam conducted with a chaperone present.  Constitutional:      General: He is not in acute distress.    Appearance: Normal appearance. He is not ill-appearing.  Cardiovascular:     Rate and Rhythm: Normal rate.  Pulmonary:     Effort: Pulmonary effort is normal.  Neurological:     Mental Status: He is alert and oriented to person, place, and time.  Psychiatric:        Attention and Perception: Attention and perception normal. He does not perceive auditory or visual hallucinations.        Mood and Affect: Affect normal. Mood is anxious.        Speech: Speech normal.        Behavior: Behavior normal. Behavior is cooperative.        Thought Content: Thought content normal. Thought content is not paranoid or delusional. Thought content does not include homicidal or suicidal ideation.        Cognition and Memory: Cognition normal.        Judgment: Judgment is impulsive.   Review of Systems  Constitutional: Negative.   HENT: Negative.    Eyes: Negative.   Respiratory: Negative.    Cardiovascular: Negative.   Gastrointestinal: Negative.   Genitourinary: Negative.   Musculoskeletal: Negative.   Skin: Negative.   Neurological: Negative.   Endo/Heme/Allergies: Negative.   Psychiatric/Behavioral:  Depression: Denies. Hallucinations: Denies. Substance abuse: Denies.  Urine drug screen positive for Amphetamine.  Patient denies stimulant and meth use. Suicidal ideas: Denies. Nervous/anxious: Stable.        Patient denies auditory/visual hallucinations, suicidal/homicidal ideation, and paranoia.  Patient also denies illicit drug use.  Stating doesn't know how amphetamine could have  gotten in his system.   Blood pressure (!) 141/92, pulse 85, temperature 99.3 F (37.4 C), temperature source Oral, resp. rate 14, SpO2 100 %. There is no height or weight on file to calculate  BMI.  Treatment Plan Summary: Daily contact with patient to assess and evaluate symptoms and progress in treatment, Medication management, and Plan transfer to continuous assessment unit at [facility]  Medication management: Started Zyprexa 5 mg nightly for psychosis and paranoia  Disposition:  Transfer to continuous assessment unit at [facility]  This service was provided via telemedicine using a 2-way, interactive audio and video technology.  Names of all persons participating in this telemedicine service and their role in this encounter. Name: Earleen Newport Role: NP  Name: Mark Mills Role: Patient   Name:  Role:   Name:  Role:    Secure message sent to patient's nurse Velna Ochs, RN informing:  Earleen Newport, NP 09/25/2021 3:19 PM

## 2021-09-25 NOTE — ED Notes (Signed)
Patient's sister calls for update. States she took all of patient's personal belongings home with her.

## 2021-09-25 NOTE — ED Notes (Signed)
Patient refused repeat EKG, kept pulling the stickers off.

## 2021-09-25 NOTE — ED Notes (Signed)
Patient ambulatory to bed 52.

## 2021-09-26 ENCOUNTER — Encounter (HOSPITAL_COMMUNITY): Payer: Self-pay | Admitting: Emergency Medicine

## 2021-09-26 NOTE — ED Provider Notes (Signed)
FBC/OBS ASAP Discharge Summary  Date and Time: 09/26/2021 10:07 AM  Name: Mark Mills  MRN:  [id number]   Discharge Diagnoses:  Final diagnoses:  Hallucinations  Amphetamine use    Subjective: Patient seen and re-evaluated face to face by this provider, chart reviewed and case discussed with Dr. Lovette Mills. On evaluation, patient is alert and oriented x4. His thought process is logical and relevant. His speech is clear and coherent. His mood is anxious and affect is congruent.  Patient states that he does not know if it's true that he was seeing things. He states that he does not remember hallucinating. Today, he denies auditory or visual hallucinations, he states I never did have hallucinations. There is no evidence that he is currently responding to internal or external stimuli. He denies suicidal ideations. He denies homicidal ideations. He denies depressive symptoms. He reports good sleep. He reports a fair appetite. He reports using crystal meth occasionally. He states that he has been using crystal meth for less than a year, mostly on weekends. He states that he last used meth prior to coming into the hospital. He reports drinking alcohol daily, on average 2-3 beers daily since age 83. Patient states that he is interested in outpatient substance abuse treatment. Patient encouraged to stop using crystal meth as it has been shown to cause psychosis. Patient states that he works full-time in [employer]. Patient resides with this sister Mark Mills.   Stay Summary:  Mark Mills is a 41 y.o. male patient admitted to [facility] ED after presenting with his sister and complaints of confusion, paranoia, and hallucinations. Patient was transferred to the [facility] for continuous assessment. Patient is under IVC. Patient has been observed on the unit without any aggressive, disruptive, self-harm, or psychotic behavior.  Patient gives verbal consent for this provider to speak with his Sister Mark Mills [phone] via telephone. Mark Mills states that she does not have any safety concerns with the patient returning home today. She states that her brother Mark Mills will pick up the patient. Mark Mills was informed that the patient is recommended to follow up with [facility] for outpatient substance use treatment.      Total Time spent with patient: 15 minutes  Past Psychiatric History: No past psych hx Past Medical History: History reviewed. No pertinent past medical history.  Past Surgical History:  Procedure Laterality Date   HERNIA REPAIR     Family History: History reviewed. No pertinent family history. Family Psychiatric History: No known hx  Social History:  Social History   Substance and Sexual Activity  Alcohol Use Yes     Social History   Substance and Sexual Activity  Drug Use Not on file    Social History   Socioeconomic History   Marital status: Single    Spouse name: Not on file   Number of children: Not on file   Years of education: Not on file   Highest education level: Not on file  Occupational History   Not on file  Tobacco Use   Smoking status: Never   Smokeless tobacco: Not on file  Substance and Sexual Activity   Alcohol use: Yes   Drug use: Not on file   Sexual activity: Not on file  Other Topics Concern   Not on file  Social History Narrative   ** Merged History Encounter **       ** Merged History Encounter **       Social Determinants of Health   Financial Resource Strain: Not on  file  Food Insecurity: Not on file  Transportation Needs: Not on file  Physical Activity: Not on file  Stress: Not on file  Social Connections: Not on file   SDOH:  SDOH Screenings   Alcohol Screen: Not on file  Depression (PHQ2-9): Not on file  Financial Resource Strain: Not on file  Food Insecurity: Not on file  Housing: Not on file  Physical Activity: Not on file  Social Connections: Not on file  Stress: Not on file  Tobacco Use: Unknown   Smoking Tobacco Use: Never    Smokeless Tobacco Use: Unknown   Passive Exposure: Not on file  Transportation Needs: Not on file    Tobacco Cessation:  N/A, patient does not currently use tobacco products  Current Medications:  Current Facility-Administered Medications  Medication Dose Route Frequency Provider Last Rate Last Admin   acetaminophen (TYLENOL) tablet 650 mg  650 mg Oral Q6H PRN Woodfin Kiss L, NP       alum & mag hydroxide-simeth (MAALOX/MYLANTA) 200-200-20 MG/5ML suspension 30 mL  30 mL Oral Q4H PRN Kelan Pritt L, NP       hydrOXYzine (ATARAX) tablet 25 mg  25 mg Oral TID PRN Deago Burruss L, NP       magnesium hydroxide (MILK OF MAGNESIA) suspension 30 mL  30 mL Oral Daily PRN Iantha Titsworth L, NP       traZODone (DESYREL) tablet 50 mg  50 mg Oral QHS PRN Dior Dominik L, NP       No current outpatient medications on file.    PTA Medications: (Not in a hospital admission)   Musculoskeletal  Strength & Muscle Tone: within normal limits Gait & Station: normal Patient leans: N/A  Psychiatric Specialty Exam  Presentation  General Appearance: Appropriate for Environment  Eye Contact:Fair  Speech:Clear and Coherent  Speech Volume:Normal  Handedness:Right   Mood and Affect  Mood:Anxious  Affect:Congruent   Thought Process  Thought Processes:Coherent  Descriptions of Associations:Intact  Orientation:Full (Time, Place and Person)  Thought Content:Logical  Diagnosis of Schizophrenia or Schizoaffective disorder in past: No    Hallucinations:Hallucinations: None  Ideas of Reference:None  Suicidal Thoughts:Suicidal Thoughts: No  Homicidal Thoughts:Homicidal Thoughts: No   Sensorium  Memory:Immediate Fair; Recent Fair; Remote Fair  Judgment:Intact  Insight:Present   Executive Functions  Concentration:Fair  Attention Span:Fair  Recall:Fair  Fund of Knowledge:Fair  Language:Fair   Psychomotor Activity  Psychomotor Activity:Psychomotor Activity:  Normal   Assets  Assets:Communication Skills; Desire for Improvement; Housing; Leisure Time; Physical Health; Social Support   Sleep  Sleep:Sleep: Fair   Nutritional Assessment (For OBS and FBC admissions only) Has the patient had a weight loss or gain of 10 pounds or more in the last 3 months?: No Has the patient had a decrease in food intake/or appetite?: No Does the patient have dental problems?: No Does the patient have eating habits or behaviors that may be indicators of an eating disorder including binging or inducing vomiting?: No Has the patient been eating poorly because of a decreased appetite?: 0    Physical Exam  Physical Exam Constitutional:      Appearance: Normal appearance.  HENT:     Head: Normocephalic and atraumatic.     Nose: Nose normal.  Eyes:     Conjunctiva/sclera: Conjunctivae normal.  Cardiovascular:     Rate and Rhythm: Normal rate.  Pulmonary:     Effort: Pulmonary effort is normal.  Musculoskeletal:        General: Normal range of motion.  Cervical back: Normal range of motion.  Neurological:     Mental Status: He is alert and oriented to person, place, and time.   Review of Systems  Constitutional: Negative.   HENT: Negative.    Eyes: Negative.   Respiratory: Negative.    Cardiovascular: Negative.   Gastrointestinal: Negative.   Genitourinary: Negative.   Musculoskeletal: Negative.   Neurological: Negative.   Endo/Heme/Allergies: Negative.   Blood pressure 119/85, pulse 100, temperature 97.7 F (36.5 C), temperature source Oral, resp. rate 16, SpO2 100 %. There is no height or weight on file to calculate BMI.  Demographic Factors:  Male  Loss Factors: NA  Historical Factors: NA  Risk Reduction Factors:   Sense of responsibility to family, Employed, Living with another person, especially a relative, and Positive social support  Continued Clinical Symptoms:  Alcohol/Substance Abuse/Dependencies  Cognitive Features That  Contribute To Risk:  None    Suicide Risk:  Minimal: No identifiable suicidal ideation.  Patients presenting with no risk factors but with morbid ruminations; may be classified as minimal risk based on the severity of the depressive symptoms  Plan Of Care/Follow-up recommendations:  Activity:  as tolerated  Disposition: to self/home.  Follow up recommendations:   Follow-up Information     [facility]   Specialty: Behavioral Health Why: to establish substance use treatment Contact information: [address] [place] [address] [phone]                  Marissa Calamity, NP 09/26/2021, 10:07 AM

## 2021-09-26 NOTE — ED Notes (Signed)
Pt currently resting on pull out bed. No s&s of distress observed. Safety maintained and will continue to monitor.

## 2021-09-26 NOTE — ED Notes (Signed)
Pt given Coffee.  Sitting up watching TV. No distress. Will continue to monitor for safety.  °

## 2021-09-26 NOTE — ED Notes (Signed)
Discharge instructions provided and Pt stated understanding. Pt alert, orient and ambulatory prior to d/c from facility. Personal belongings returned from locker number 51. Pt escorted to the front lobby to d/c from facility where his sister was waiting to pick him up. Safety maintained.

## 2021-09-26 NOTE — ED Notes (Signed)
Pt sleeping at present, no distress noted.  Monitoring for safety. 

## 2021-09-26 NOTE — Discharge Instructions (Addendum)
Discharge recommendations:   Please see information for follow-up appointment with psychiatry and therapy.  Please follow up with your primary care provider for all medical related needs.   Therapy: We recommend that patient participate in individual therapy to address mental health concerns.  Safety:  The patient should abstain from use of illicit substances/drugs and abuse of any medications. If symptoms worsen or do not continue to improve or if the patient becomes actively suicidal or homicidal then it is recommended that the patient return to the closest hospital emergency department, the [facility], or call 911 for further evaluation and treatment. National Suicide Prevention Lifeline 1-800-SUICIDE or 1-800-273-8255.  About 988 988 offers 24/7 access to trained crisis counselors who can help people experiencing mental health-related distress. People can call or text 988 or chat 988lifeline.org for themselves or if they are worried about a loved one who may need crisis support.      

## 2021-09-26 NOTE — ED Notes (Signed)
Pt pacing back and forth on the observation unit. Going into the flex unit. Put on another patients shoes. Put blanket over his head and shoulders and stated that he was "[place]". Rambling in conversation. Removed his ID bracelet but had bracelet from ED in his pocket. Removed the linen from his bed. Staff attempted to redirect, unsuccessful. Informed provider that Pt was having bizarre behaviors via secure chat. Admin PRN [employer]. Safety maintained and will continue to monitor.

## 2021-09-27 LAB — HEMOGLOBIN A1C
Hgb A1c MFr Bld: 5.9 % — ABNORMAL HIGH (ref 4.8–5.6)
Mean Plasma Glucose: 123 mg/dL

## 2021-10-12 ENCOUNTER — Telehealth (HOSPITAL_COMMUNITY): Payer: Self-pay

## 2021-10-12 NOTE — BH Assessment (Signed)
Care Management - [facility] Follow Up Discharges   Writer attempted to make contact with patient today and was unsuccessful.  Writer left a HIPPA compliant voice message.   Per chart review, patient was provided with substance abuse outpatient resources.
